# Patient Record
Sex: Female | Born: 1985 | Race: White | Hispanic: No | Marital: Married | Smoking: Never smoker
Health system: Southern US, Community
[De-identification: ages and names within clinical notes are randomized; demographics above are authoritative.]

## PROBLEM LIST (undated history)

## (undated) DIAGNOSIS — G43909 Migraine, unspecified, not intractable, without status migrainosus: Secondary | ICD-10-CM

## (undated) DIAGNOSIS — O24419 Gestational diabetes mellitus in pregnancy, unspecified control: Secondary | ICD-10-CM

## (undated) DIAGNOSIS — E119 Type 2 diabetes mellitus without complications: Secondary | ICD-10-CM

## (undated) HISTORY — PX: KIDNEY SURGERY: SHX687

---

## 2014-10-09 ENCOUNTER — Ambulatory Visit
Admission: EM | Admit: 2014-10-09 | Discharge: 2014-10-09 | Disposition: A | Discharge status: Home / Self Care | Payer: BLUE CROSS/BLUE SHIELD | Attending: Internal Medicine | Admitting: Internal Medicine

## 2014-10-09 DIAGNOSIS — M5412 Radiculopathy, cervical region: Secondary | ICD-10-CM | POA: Diagnosis not present

## 2014-10-09 HISTORY — DX: Migraine, unspecified, not intractable, without status migrainosus: G43.909

## 2014-10-09 MED ORDER — DIAZEPAM 2 MG PO TABS: 2.0000 mg | ORAL_TABLET | Freq: Two times a day (BID) | ORAL | Status: DC

## 2014-10-09 MED ORDER — NAPROXEN 500 MG PO TABS: 500.0000 mg | ORAL_TABLET | Freq: Two times a day (BID) | ORAL | Status: DC

## 2014-10-09 NOTE — ED Notes (Signed)
Multiple medical complaints. States right forearm has felt numb x 1 week "pretty persistant". Also c/o headache since Monday when menses started and states numbness and tingling in face. Earlier this week had "some tingling" right lower leg.

## 2014-10-09 NOTE — ED Provider Notes (Signed)
CSN: [id number]     Arrival date & time 10/09/14  1823 History   First MD Initiated Contact with Patient 10/09/14 1855     Chief Complaint  Patient presents with  . Headache   (Consider location/radiation/quality/duration/timing/severity/associated sxs/prior Treatment) HPI   This a 29 year old female who presents with one-week history of a stiff neck a headache  not similar to her usual migraines and numbness of her volar right forearm. Doesn't remember any specific incident that may have injured her neck but does relate sometimes sleeping on the couch 2 pillows. A physician assistant at work and recommended that she take an additional dose of ibuprofen she states helped her pain. However the increased dose of ibuprofen caused her to have a nervous feeling. He has noticed some recent photophobia and increased tolerance to loud sounds. She denies any nausea or vomiting loss of muscle power and no incontinence. She's had no symptoms of incoordination stumbling falling or syncope.  Past Medical History  Diagnosis Date  . Migraines    Past Surgical History  Procedure Laterality Date  . Kidney surgery      age 80   Family History  Problem Relation Age of Onset  . Thyroid disease Mother   . Diabetes Father    History  Substance Use Topics  . Smoking status: Never Smoker   . Smokeless tobacco: Not on file  . Alcohol Use: No   OB History    No data available     Review of Systems  Eyes: Positive for photophobia.  Neurological: Positive for numbness and headaches.  All other systems reviewed and are negative.   Allergies  Sulfa antibiotics  Home Medications   Prior to Admission medications   Medication Sig Start Date End Date Taking? Authorizing Provider  diazepam (VALIUM) 2 MG tablet Take 1 tablet (2 mg total) by mouth 2 (two) times daily at 8 am and 10 pm. 10/09/14   Lutricia Feil, PA-C  naproxen (NAPROSYN) 500 MG tablet Take 1 tablet (500 mg total) by mouth 2 (two) times  daily with a meal. 10/09/14   Lutricia Feil, PA-C   BP 115/78 mmHg  Pulse 64  Temp(Src) 97.7 F (36.5 C) (Tympanic)  Resp 16  Ht  (1.676 m)  Wt 125 lb (56.7 kg)  BMI 20.19 kg/m2  SpO2 100%  LMP 10/06/2014 (Exact Date) Physical Exam  Constitutional: She is oriented to person, place, and time. She appears well-developed and well-nourished.  HENT:  Head: Normocephalic and atraumatic.  Eyes: EOM are normal. Pupils are equal, round, and reactive to light. Right eye exhibits no discharge. Left eye exhibits no discharge.  Neck: Normal range of motion. Neck supple. No thyromegaly present.  Musculoskeletal: Normal range of motion. She exhibits no edema or tenderness.  Lymphadenopathy:    She has no cervical adenopathy.  Neurological: She is alert and oriented to person, place, and time. No cranial nerve deficit. Coordination normal.  Examination of the neck shows a normal range of motion without any discomfort with flexion, extension, rotation or flexion with lateral extension there is no axial compression tenderness. Trapezii are tender bilaterally with palpable muscle spasm more prevalent on the right. Upper extremity DTRs are brisk at 3+ over 4 and bilaterally symmetrical. Strength of the upper extremities is equal and symmetrical and strong. Lower extremity DTRs are also brisk at 3+ over 4 and she has a 2 beat clonus on the right is not present on the left. Finger-nose-finger is intact Romberg  is negative her gait is normal. Speech is intact. Patient does seem rather sad rarely smiles tends to keep her eyes closed throughout the examination. Her husband was present in the room and seemed very supportive.  Skin: Skin is warm and dry.  Psychiatric: She has a normal mood and affect. Her behavior is normal. Judgment and thought content normal.  Nursing note and vitals reviewed.   ED Course  Procedures (including critical care time) Labs Review Labs Reviewed - No data to  display  Imaging Review No results found.   MDM   1. Cervical radiculitis    Discharge Medication List as of 10/09/2014  7:34 PM    START taking these medications   Details  diazepam (VALIUM) 2 MG tablet Take 1 tablet (2 mg total) by mouth 2 (two) times daily at 8 am and 10 pm., Starting 10/09/2014, Until Discontinued, Print    naproxen (NAPROSYN) 500 MG tablet Take 1 tablet (500 mg total) by mouth 2 (two) times daily with a meal., Starting 10/09/2014, Until Discontinued, Print       Plan: 1. Diagnosis reviewed with patient 2. rx as per orders; risks, benefits, potential side effects reviewed with patient 3. Recommend supportive treatment with ice/heat Biofrereze 4. F/u prn if symptoms worsen or don't improve    Lutricia Feil, PA-C 10/09/14 1939

## 2014-10-09 NOTE — Discharge Instructions (Signed)

## 2015-04-26 NOTE — L&D Delivery Note (Signed)
Delivery Note  First Stage: Labor Induction: 01/14/16 for cervidil Labor onset: 01/15/16 @ 0900 Augmentation : Pitocin, AROM Analgesia Eliezer Lofts/Anesthesia intrapartum: Nitrous Oxide and Epidural AROM at 1136 - clear fluid  Second Stage: Complete dilation at 1128 Onset of pushing at 1430 FHR second stage: Category 2 FHR Tracing: Baseline: 150 bpm/ moderate variability / +accels/ variable decels with contractions to nadir of 90-115 bpm with good return to baseline  Delivery of a viable female "Gunnar Bullamery Kate" at 1653 by Carlean JewsMeredith Twala Collings, CNM in ROA position using the Ritgen maneuver Loose nuchal cord x 1 reduced over head Cord double clamped after cessation of pulsation, cut by FOB Cord Blood eval not indicated   Third Stage: Placenta delivered via Shultz intact with trailing membranes teased out with ring forceps with 3VC @ 1703 Placenta disposition: Hospital disposal  Uterine tone firm with massage / bleeding minimal  1st laceration identified  Anesthesia for repair: Epidural and 1% Lidocaine  Repair: 2.0 Vicryl Est. Blood Loss (mL): 250 mL  Complications: none  Mom to postpartum.  Baby to Couplet care / Skin to Skin.  Newborn: Birth Weight: 2800g (6#2.8oz) Apgar Scores: 8, 9 Feeding planned: Breast and Formula  Carlean JewsMeredith Maliq Pilley, CNM

## 2015-06-16 LAB — OB RESULTS CONSOLE ABO/RH: RH Type: POSITIVE

## 2015-06-16 LAB — OB RESULTS CONSOLE RPR: RPR: NONREACTIVE

## 2015-06-16 LAB — OB RESULTS CONSOLE HIV ANTIBODY (ROUTINE TESTING): HIV: NONREACTIVE

## 2015-06-16 LAB — OB RESULTS CONSOLE HEPATITIS B SURFACE ANTIGEN: Hepatitis B Surface Ag: NEGATIVE

## 2015-06-16 LAB — OB RESULTS CONSOLE VARICELLA ZOSTER ANTIBODY, IGG: VARICELLA IGG: IMMUNE

## 2015-06-16 LAB — OB RESULTS CONSOLE RUBELLA ANTIBODY, IGM: RUBELLA: IMMUNE

## 2015-06-16 LAB — OB RESULTS CONSOLE ANTIBODY SCREEN: Antibody Screen: NEGATIVE

## 2015-12-16 ENCOUNTER — Other Ambulatory Visit: Payer: Self-pay

## 2015-12-16 DIAGNOSIS — O359XX1 Maternal care for (suspected) fetal abnormality and damage, unspecified, fetus 1: Secondary | ICD-10-CM

## 2015-12-17 ENCOUNTER — Ambulatory Visit: Payer: BLUE CROSS/BLUE SHIELD

## 2015-12-21 LAB — OB RESULTS CONSOLE GBS: STREP GROUP B AG: NEGATIVE

## 2015-12-21 LAB — OB RESULTS CONSOLE GC/CHLAMYDIA
Chlamydia: NEGATIVE
Gonorrhea: NEGATIVE

## 2015-12-21 LAB — OB RESULTS CONSOLE RPR: RPR: NONREACTIVE

## 2015-12-24 ENCOUNTER — Ambulatory Visit
Admission: RE | Admit: 2015-12-24 | Discharge: 2015-12-24 | Disposition: A | Discharge status: Home / Self Care | Payer: Managed Care, Other (non HMO) | Source: Ambulatory Visit | Attending: Maternal & Fetal Medicine | Admitting: Maternal & Fetal Medicine

## 2015-12-24 ENCOUNTER — Other Ambulatory Visit: Payer: Self-pay | Admitting: Maternal & Fetal Medicine

## 2015-12-24 ENCOUNTER — Encounter: Payer: Self-pay | Admitting: *Deleted

## 2015-12-24 DIAGNOSIS — Z3490 Encounter for supervision of normal pregnancy, unspecified, unspecified trimester: Secondary | ICD-10-CM

## 2015-12-24 DIAGNOSIS — O365930 Maternal care for other known or suspected poor fetal growth, third trimester, not applicable or unspecified: Secondary | ICD-10-CM | POA: Diagnosis present

## 2015-12-24 DIAGNOSIS — O359XX1 Maternal care for (suspected) fetal abnormality and damage, unspecified, fetus 1: Secondary | ICD-10-CM

## 2015-12-24 DIAGNOSIS — Z3A36 36 weeks gestation of pregnancy: Secondary | ICD-10-CM | POA: Insufficient documentation

## 2015-12-31 ENCOUNTER — Other Ambulatory Visit: Payer: Self-pay | Admitting: Maternal & Fetal Medicine

## 2015-12-31 ENCOUNTER — Ambulatory Visit
Admission: RE | Admit: 2015-12-31 | Discharge: 2015-12-31 | Disposition: A | Discharge status: Home / Self Care | Payer: BLUE CROSS/BLUE SHIELD | Source: Ambulatory Visit | Attending: Maternal & Fetal Medicine | Admitting: Maternal & Fetal Medicine

## 2015-12-31 DIAGNOSIS — Z3490 Encounter for supervision of normal pregnancy, unspecified, unspecified trimester: Secondary | ICD-10-CM

## 2015-12-31 DIAGNOSIS — IMO0002 Reserved for concepts with insufficient information to code with codable children: Secondary | ICD-10-CM

## 2016-01-04 ENCOUNTER — Other Ambulatory Visit: Payer: Self-pay

## 2016-01-04 DIAGNOSIS — O365990 Maternal care for other known or suspected poor fetal growth, unspecified trimester, not applicable or unspecified: Secondary | ICD-10-CM

## 2016-01-07 ENCOUNTER — Telehealth: Payer: Self-pay

## 2016-01-07 ENCOUNTER — Inpatient Hospital Stay: Admission: RE | Admit: 2016-01-07 | Payer: BLUE CROSS/BLUE SHIELD | Source: Ambulatory Visit

## 2016-01-07 NOTE — Telephone Encounter (Signed)
Patient unable to keep appointment today, notified Dr. Quin HoopEllestad.  Plan of care established by Dr. Quin HoopEllestad is for the patient to [facility] and [facility] Monday for BPP/Dopplers.  Appointment scheduled for Monday, September 18th @ 1600, patient aware.  Patient instructed to call [facility] to schedule NST for tomorrow.  [facility] notified of patient inability to keep appointment today and the plan of care recommendations.  [facility] staff to contact patient to schedule a NST appointment.

## 2016-01-11 ENCOUNTER — Ambulatory Visit
Admission: RE | Admit: 2016-01-11 | Discharge: 2016-01-11 | Disposition: A | Discharge status: Home / Self Care | Payer: Managed Care, Other (non HMO) | Source: Ambulatory Visit | Attending: Obstetrics and Gynecology | Admitting: Obstetrics and Gynecology

## 2016-01-11 DIAGNOSIS — Z3A38 38 weeks gestation of pregnancy: Secondary | ICD-10-CM | POA: Insufficient documentation

## 2016-01-11 DIAGNOSIS — O365990 Maternal care for other known or suspected poor fetal growth, unspecified trimester, not applicable or unspecified: Secondary | ICD-10-CM | POA: Insufficient documentation

## 2016-01-11 DIAGNOSIS — O359XX1 Maternal care for (suspected) fetal abnormality and damage, unspecified, fetus 1: Secondary | ICD-10-CM | POA: Insufficient documentation

## 2016-01-13 ENCOUNTER — Emergency Department
Admission: EM | Admit: 2016-01-13 | Discharge: 2016-01-13 | Disposition: A | Discharge status: Home / Self Care | Payer: BLUE CROSS/BLUE SHIELD

## 2016-01-14 ENCOUNTER — Inpatient Hospital Stay
Admission: EM | Admit: 2016-01-14 | Discharge: 2016-01-17 | DRG: 775 | Disposition: A | Discharge status: Home / Self Care | Payer: Managed Care, Other (non HMO) | Attending: Obstetrics and Gynecology | Admitting: Obstetrics and Gynecology

## 2016-01-14 ENCOUNTER — Encounter: Payer: Self-pay | Admitting: *Deleted

## 2016-01-14 DIAGNOSIS — Z9889 Other specified postprocedural states: Secondary | ICD-10-CM | POA: Diagnosis not present

## 2016-01-14 DIAGNOSIS — Z882 Allergy status to sulfonamides status: Secondary | ICD-10-CM

## 2016-01-14 DIAGNOSIS — Z833 Family history of diabetes mellitus: Secondary | ICD-10-CM

## 2016-01-14 DIAGNOSIS — Z3A39 39 weeks gestation of pregnancy: Secondary | ICD-10-CM

## 2016-01-14 DIAGNOSIS — Z8349 Family history of other endocrine, nutritional and metabolic diseases: Secondary | ICD-10-CM

## 2016-01-14 LAB — CBC
HEMATOCRIT: 37.6 % (ref 35.0–47.0)
Hemoglobin: 12.9 g/dL (ref 12.0–16.0)
MCH: 32.3 pg (ref 26.0–34.0)
MCHC: 34.4 g/dL (ref 32.0–36.0)
MCV: 93.9 fL (ref 80.0–100.0)
Platelets: 181 10*3/uL (ref 150–440)
RBC: 4 MIL/uL (ref 3.80–5.20)
RDW: 15.5 % — ABNORMAL HIGH (ref 11.5–14.5)
WBC: 10.5 10*3/uL (ref 3.6–11.0)

## 2016-01-14 LAB — TYPE AND SCREEN
ABO/RH(D): B POS
Antibody Screen: NEGATIVE

## 2016-01-14 MED ORDER — LIDOCAINE HCL (PF) 1 % IJ SOLN: 30.0000 mL | INTRAMUSCULAR | Status: DC | PRN

## 2016-01-14 MED ORDER — MAGNESIUM SULFATE 50 % IJ SOLN: 1.0000 g/h | INTRAVENOUS | Status: DC

## 2016-01-14 MED ORDER — OXYTOCIN 40 UNITS IN LACTATED RINGERS INFUSION - SIMPLE MED: 2.5000 [IU]/h | INTRAVENOUS | Status: DC

## 2016-01-14 MED ORDER — ACETAMINOPHEN 325 MG PO TABS
650.0000 mg | ORAL_TABLET | ORAL | Status: DC | PRN
  Filled 2016-01-14: qty 2

## 2016-01-14 MED ORDER — OXYCODONE-ACETAMINOPHEN 5-325 MG PO TABS: 2.0000 | ORAL_TABLET | ORAL | Status: DC | PRN

## 2016-01-14 MED ORDER — LACTATED RINGERS IV SOLN
INTRAVENOUS | Status: DC
  Administered 2016-01-14 – 2016-01-15 (×2): via INTRAVENOUS

## 2016-01-14 MED ORDER — OXYCODONE-ACETAMINOPHEN 5-325 MG PO TABS: 1.0000 | ORAL_TABLET | ORAL | Status: DC | PRN

## 2016-01-14 MED ORDER — OXYTOCIN BOLUS FROM INFUSION: 500.0000 mL | Freq: Once | INTRAVENOUS | Status: DC

## 2016-01-14 MED ORDER — SOD CITRATE-CITRIC ACID 500-334 MG/5ML PO SOLN
30.0000 mL | ORAL | Status: DC | PRN
  Filled 2016-01-14: qty 30

## 2016-01-14 MED ORDER — ONDANSETRON HCL 4 MG/2ML IJ SOLN: 4.0000 mg | Freq: Four times a day (QID) | INTRAMUSCULAR | Status: DC | PRN

## 2016-01-14 MED ORDER — LACTATED RINGERS IV SOLN: 500.0000 mL | INTRAVENOUS | Status: DC | PRN

## 2016-01-14 NOTE — H&P (Signed)
HISTORY AND PHYSICAL  HISTORY OF PRESENT ILLNESS: [name] is a 30 y.o. G1P0 at 4449w0d by LMP consistent with  week ultrasound with a pregnancy complicated by IUGR presenting for induction of labor.   She has not been having contractions and denies leakage of fluid, vaginal bleeding, or decreased fetal movement.    REVIEW OF SYSTEMS: A complete review of systems was performed and was specifically negative for headache, changes in vision, RUQ pain, shortness of breath, chest pain, lower extremity edema and dysuria.   HISTORY:  Past Medical History:  Diagnosis Date  . Migraines     Past Surgical History:  Procedure Laterality Date  . KIDNEY SURGERY     age 89    No current facility-administered medications on file prior to encounter.    Current Outpatient Prescriptions on File Prior to Encounter  Medication Sig Dispense Refill  . ferrous sulfate 325 (65 FE) MG EC tablet Take 325 mg by mouth 3 (three) times daily with meals.    . Prenatal Vit-Fe Fumarate-FA (MULTIVITAMIN-PRENATAL) 27-0.8 MG TABS tablet Take 1 tablet by mouth daily at 12 noon.       Allergies  Allergen Reactions  . Sulfa Antibiotics Rash    OB History  Gravida Para Term Preterm AB Living  1         0  SAB TAB Ectopic Multiple Live Births               # Outcome Date GA Lbr Len/2nd Weight Sex Delivery Anes PTL Lv  1 Current              Family History  Problem Relation Age of Onset  . Thyroid disease Mother   . Diabetes Father    Gynecologic History: Neg GC/CH History of Abnormal Pap Smear: None found History of STI:neg  Social History  Substance Use Topics  . Smoking status: Never Smoker  . Smokeless tobacco: Never Used  . Alcohol use No    PHYSICAL EXAM: Temp:  [98.3 F (36.8 C)] 98.3 F (36.8 C) (09/21 1642) Pulse Rate:  [70] 70 (09/21 1642) Resp:  [16] 16 (09/21 1642) BP: (118)/(80) 118/80 (09/21 1642) Weight:  [155 lb (70.3 kg)] 155 lb (70.3 kg) (09/21 1728)  GENERAL: NAD  AAOx3 CHEST:CTAB no increased work of breathing CV:RRR no appreciable murmurs, rubs, gallops ABDOMEN: gravid, nontender, EFW g by Leopolds EXTREMITIES:  Warm and well-perfused, nontender, nonedematous, DTRs  clonus CERVIX: 4/80/vtx-2   FHT:s baseline with  Mod variability, +  accelerations and no  Decelerations, Strip reviewed and pt has had variable decels. Pt had 3 late decels much earlier  Toco: q 2 mins  DIAGNOSTIC STUDIES: No results for input(s): WBC, HGB, HCT, PLT, NA, K, CL, CO2, BUN, CREATININE, LABGLOM, GLUCOSE, CALCIUM, BILIDIR, ALKPHOS, AST, ALT, PROT, MG in the last 168 hours.  Invalid input(s): LABALB, UA  PRENATAL STUDIES:  Prenatal Labs:  MBT: B pos; Rubella immune, Varicella immune, HIV neg, RPR neg, Hep B neg, GC/CT neg, GBS neg , glucola 73  Last US Dopplers 01/11/16 38 4/7  Wks:2.14 Dopplers WNL, .placenta  above the os, AF wnl, normal anatomy  ASSESSMENT AND PLAN:  1. Fetal Well being  - Fetal Tracing: reviewed and Cat 1 now - Ultrasound:  reviewed, as above - Group B Streptococcus: neg - Presentation: confirmed by RN  2. Routine OB: - Prenatal labs reviewed, as above - Rh pos  3. Induction of Labor:  -  Contractions external toco in place -  Pelvis has not been proven -  Plan for induction with Pitocin   4. Post Partum Planning: - Infant feeding: Breast

## 2016-01-14 NOTE — Progress Notes (Signed)
Blood bank calling to report T&S has hemolysed, will need new order place to recollect.

## 2016-01-15 ENCOUNTER — Inpatient Hospital Stay: Payer: Managed Care, Other (non HMO) | Admitting: Anesthesiology

## 2016-01-15 MED ORDER — ONDANSETRON HCL 4 MG PO TABS: 4.0000 mg | ORAL_TABLET | ORAL | Status: DC | PRN

## 2016-01-15 MED ORDER — SIMETHICONE 80 MG PO CHEW
80.0000 mg | CHEWABLE_TABLET | ORAL | Status: DC | PRN
Start: 2016-01-15 — End: 2016-01-17

## 2016-01-15 MED ORDER — ZOLPIDEM TARTRATE 5 MG PO TABS
5.0000 mg | ORAL_TABLET | Freq: Every evening | ORAL | Status: DC | PRN
Start: 2016-01-15 — End: 2016-01-17

## 2016-01-15 MED ORDER — FENTANYL 2.5 MCG/ML W/ROPIVACAINE 0.2% IN NS 100 ML EPIDURAL INFUSION (ARMC-ANES)
EPIDURAL | Status: DC | PRN
  Administered 2016-01-15: 10 mL/h via EPIDURAL

## 2016-01-15 MED ORDER — PRENATAL MULTIVITAMIN CH
1.0000 | ORAL_TABLET | Freq: Every day | ORAL | Status: DC
  Administered 2016-01-16 – 2016-01-17 (×2): 1 via ORAL
  Filled 2016-01-15 (×2): qty 1

## 2016-01-15 MED ORDER — FENTANYL 2.5 MCG/ML W/ROPIVACAINE 0.2% IN NS 100 ML EPIDURAL INFUSION (ARMC-ANES)
EPIDURAL | Status: AC
  Filled 2016-01-15: qty 100

## 2016-01-15 MED ORDER — DIBUCAINE 1 % RE OINT: 1.0000 "application " | TOPICAL_OINTMENT | RECTAL | Status: DC | PRN

## 2016-01-15 MED ORDER — COCONUT OIL OIL
1.0000 | TOPICAL_OIL | Status: DC | PRN
Start: 2016-01-15 — End: 2016-01-17
  Administered 2016-01-15: 1 via TOPICAL
  Filled 2016-01-15: qty 120

## 2016-01-15 MED ORDER — DIPHENHYDRAMINE HCL 25 MG PO CAPS: 25.0000 mg | ORAL_CAPSULE | Freq: Four times a day (QID) | ORAL | Status: DC | PRN

## 2016-01-15 MED ORDER — LIDOCAINE HCL (PF) 1 % IJ SOLN
INTRAMUSCULAR | Status: DC | PRN
  Administered 2016-01-15: 3 mL

## 2016-01-15 MED ORDER — ACETAMINOPHEN 325 MG PO TABS: 650.0000 mg | ORAL_TABLET | ORAL | Status: DC | PRN

## 2016-01-15 MED ORDER — LIDOCAINE-EPINEPHRINE (PF) 1.5 %-1:200000 IJ SOLN
INTRAMUSCULAR | Status: DC | PRN
  Administered 2016-01-15: 3 mL via PERINEURAL

## 2016-01-15 MED ORDER — ONDANSETRON HCL 4 MG/2ML IJ SOLN: 4.0000 mg | INTRAMUSCULAR | Status: DC | PRN

## 2016-01-15 MED ORDER — IBUPROFEN 600 MG PO TABS
600.0000 mg | ORAL_TABLET | Freq: Four times a day (QID) | ORAL | Status: DC
  Administered 2016-01-15 – 2016-01-17 (×7): 600 mg via ORAL
  Filled 2016-01-15 (×7): qty 1

## 2016-01-15 MED ORDER — OXYCODONE-ACETAMINOPHEN 5-325 MG PO TABS: 2.0000 | ORAL_TABLET | ORAL | Status: DC | PRN

## 2016-01-15 MED ORDER — SENNOSIDES-DOCUSATE SODIUM 8.6-50 MG PO TABS
2.0000 | ORAL_TABLET | ORAL | Status: DC
  Administered 2016-01-15 – 2016-01-17 (×2): 2 via ORAL
  Filled 2016-01-15: qty 2

## 2016-01-15 MED ORDER — WITCH HAZEL-GLYCERIN EX PADS: 1.0000 "application " | MEDICATED_PAD | CUTANEOUS | Status: DC | PRN

## 2016-01-15 MED ORDER — BENZOCAINE-MENTHOL 20-0.5 % EX AERO
1.0000 "application " | INHALATION_SPRAY | CUTANEOUS | Status: DC | PRN
  Administered 2016-01-15: 1 via TOPICAL
  Filled 2016-01-15: qty 56

## 2016-01-15 MED ORDER — BUPIVACAINE HCL (PF) 0.25 % IJ SOLN
INTRAMUSCULAR | Status: DC | PRN
  Administered 2016-01-15 (×2): 5 mL via EPIDURAL

## 2016-01-15 MED ORDER — OXYCODONE-ACETAMINOPHEN 5-325 MG PO TABS: 1.0000 | ORAL_TABLET | ORAL | Status: DC | PRN

## 2016-01-15 NOTE — Progress Notes (Signed)
S:  Late decel at 1128  To nadir of 80bpm due to hypotension, and supine position - pitocin off, IVF bolus, oxygen by face mask, left lateral position - good return to baseline 150 bpm       Discussed risks/benefits of AROM and patient and husband agree  O:  VS: Blood pressure 130/74, pulse 60, temperature 98 F (36.7 C), temperature source Axillary, resp. rate 18, height 5\' 6"  (1.676 m), weight 70.3 kg (155 lb), SpO2 100 %.        FHR : baseline 145-150 bpm / variability moderate / accelerations + / occasional early deceleration and late decel as mentioned above        Toco: contractions every 1-3 minutes / strong         Cervix : Dilation: 10 Dilation Complete Date: 01/15/16 Dilation Complete Time: 1128 Effacement (%): 100 Cervical Position: Middle Station: +1 Presentation: Vertex Exam by:: M.Shyasia Funches, CNM        Membranes: AROM - clear fluid  A: Active labor     FHR category 2  P: Continue expectant management      Anticipate NSVD  Dr. Elesa MassedWard aware and updated on status and plan of care.   Carlean JewsMeredith Shaindel Sweeten, CNM

## 2016-01-15 NOTE — Plan of Care (Signed)
svd of viable female . 513pm . Repair of 1st degree laceration by meredith sigmon,cnm

## 2016-01-15 NOTE — Anesthesia Procedure Notes (Signed)
Epidural Patient location during procedure: OB Start time: 01/15/2016 10:42 AM End time: 01/15/2016 11:15 AM  Staffing Resident/CRNA: Junious SilkNOLES, Stephana Morell Performed: resident/CRNA   Preanesthetic Checklist Completed: patient identified, site marked, surgical consent, pre-op evaluation, timeout performed, IV checked, risks and benefits discussed and monitors and equipment checked  Epidural Patient position: sitting Prep: Betadine Patient monitoring: heart rate, continuous pulse ox and blood pressure Approach: midline Location: L4-L5 Injection technique: LOR air  Needle:  Needle type: Tuohy  Needle gauge: 18 G Needle length: 9 cm and 9 Catheter type: closed end flexible Catheter size: 20 Guage Test dose: negative and 1.5% lidocaine with Epi 1:200 K  Assessment Sensory level: T10 Events: blood not aspirated, injection not painful, no injection resistance, negative IV test and no paresthesia  Additional Notes   Patient tolerated the insertion well without complications.Reason for block:procedure for pain

## 2016-01-15 NOTE — Progress Notes (Signed)
Discussed with pt and s/o plan for labor pain mgmt. Pt states she would like to try nitrous and not really wanting an Epidural but will keep it optional. Nitrous info sheet with instructions for usage, patient agreement and what to expect reviewed with pt and s/o. Questions addressed. Understanding verbalized.

## 2016-01-15 NOTE — Progress Notes (Signed)
Repair still in progress

## 2016-01-15 NOTE — Progress Notes (Signed)
Pt pushing well after epidural turned down to 2. Meredith in room with pt.

## 2016-01-15 NOTE — Progress Notes (Signed)
S:  Pt. Requesting epidural - has not used nitrous in >30 minutes   O:  VS: Blood pressure 130/74, pulse 60, temperature 98 F (36.7 C), temperature source Axillary, resp. rate 18, height 5\' 6"  (1.676 m), weight 70.3 kg (155 lb), SpO2 100 %.        FHR : baseline 135 bpm / variability moderate / accelerations + / occ variable decelerations to nadir of 90bmp with good return to baseline         Toco: contractions every 2-3 minutes / moderate        Cervix : 9cm/100%/-2/vtx        Membranes: intact, BBOW  A: Active labor     FHR category 2  P: Plan for epidural     Discussed risks of AROM with high fetal station and advised for patient to be comfortable with epidural and let the baby come down for possible slow rupture      Anticipate NSVD  Dr. Elesa MassedWard updated with plan of care  Dana Benton, CNM

## 2016-01-15 NOTE — Progress Notes (Signed)
S:Nitrous is helping . + CTX, no LOF, VB O: Vitals:   01/15/16 0714 01/15/16 0716 01/15/16 0721 01/15/16 0745  BP: 122/81   130/74  Pulse: 73   60  Resp:      Temp:      TempSrc:      SpO2:  100% 100%   Weight:      Height:        Gen: NAD, AAOx3      Abd: FNTTP      Ext: Non-tender, Nonedmeatous    FHT:+  mod var + accelerations, Variable decelerations with Cat 2 strip TOCO: Q 2min SVE:5/90/vtx-2   A/P:  30 y.o. yo G1P0 at 1984w1d for IUGR  Labor: progressing  FWB: Reassuring Cat 1 tracing. EFW 5#2  GBS:neg   Sharee Pimplearon W Jones 7:54 AM

## 2016-01-15 NOTE — Progress Notes (Signed)
I am assuming care of this patient, with Dr. Elesa MassedWard as my back-up S: Doing well with nitrous oxide, on birthing ball, back massage     Still having intense low back pain      Would like to discuss cervical exam and AROM with partner and family before proceeding      On 6 milliunits of Pitocin   O:  VS: Blood pressure 130/74, pulse 60, temperature 98 F (36.7 C), temperature source Axillary, resp. rate 18, height 5\' 6"  (1.676 m), weight 70.3 kg (155 lb), SpO2 100 %.        FHR : baseline 130 bpm / variability moderate / accelerations + / occasional variable decelerations        Toco: contractions every 1-2.5 minutes / moderate         Cervix : Dilation: 5 Effacement (%): 90 Cervical Position: Middle Station: -2 Presentation: Vertex Exam by:: Manual MeierN. Aten, RN        Membranes: Intact  A: Latent labor     FHR category 2  P: Continue expectant management      Patient to notify nurse when she is ready for cervical exam and AROM      Reassess in 1-2 hours     Anticipate NSVD  Dana Benton, CNM

## 2016-01-16 LAB — CBC
HCT: 33.9 % — ABNORMAL LOW (ref 35.0–47.0)
Hemoglobin: 11.3 g/dL — ABNORMAL LOW (ref 12.0–16.0)
MCH: 31.4 pg (ref 26.0–34.0)
MCHC: 33.2 g/dL (ref 32.0–36.0)
MCV: 94.4 fL (ref 80.0–100.0)
PLATELETS: 161 10*3/uL (ref 150–440)
RBC: 3.59 MIL/uL — AB (ref 3.80–5.20)
RDW: 14.7 % — AB (ref 11.5–14.5)
WBC: 14.2 10*3/uL — ABNORMAL HIGH (ref 3.6–11.0)

## 2016-01-16 LAB — RPR: RPR Ser Ql: NONREACTIVE

## 2016-01-16 NOTE — Anesthesia Postprocedure Evaluation (Signed)
Anesthesia Post Note  Patient: Vernice JeffersonJennifer Presswood  Procedure(s) Performed: * No procedures listed *  Patient location during evaluation: Mother Baby Anesthesia Type: Epidural Level of consciousness: awake and alert Pain management: pain level controlled Vital Signs Assessment: post-procedure vital signs reviewed and stable Respiratory status: spontaneous breathing, nonlabored ventilation and respiratory function stable Cardiovascular status: stable Postop Assessment: no headache, no backache and epidural receding Anesthetic complications: no Comments: Patient with some mild back pain; no headache or pruritis at this time; epidural site is clean, dry, and without draining or erythema    Last Vitals:  Vitals:   01/16/16 0511 01/16/16 0745  BP: (!) 109/56 120/73  Pulse: 69 65  Resp: 19   Temp: 36.6 C 37.2 C    Last Pain:  Vitals:   01/16/16 0745  TempSrc: Oral  PainSc:                  Lenard SimmerAndrew Natividad Schlosser

## 2016-01-16 NOTE — Progress Notes (Signed)
PPD #1, SVD, baby girl "Gunnar Bullamery Kate"  S:  Reports feeling good, but having muscle soreness in legs             Tolerating po/ No nausea or vomiting             Bleeding is light             Pain controlled with Tylenol and Motrin             Up ad lib / ambulatory / voiding QS  Newborn breast feeding with formula supplementation   O:               VS: BP 120/73   Pulse 65   Temp 99 F (37.2 C) (Oral)   Resp 19   Ht 5\' 6"  (1.676 m)   Wt 70.3 kg (155 lb)   LMP  (LMP Unknown)   SpO2 99%   Breastfeeding? Unknown   BMI 25.02 kg/m    LABS:              Recent Labs  01/14/16 2124 01/16/16 0642  WBC 10.5 14.2*  HGB 12.9 11.3*  PLT 181 161               Blood type: --/--/B POS (09/21 2237)  Rubella: Immune (02/21 0000)                     I&O: Intake/Output      09/22 0701 - 09/23 0700 09/23 0701 - 09/24 0700   Urine (mL/kg/hr) 600 (0.4)    Blood 250 (0.1)    Total Output 850     Net -850          Urine Occurrence 1 x                  Physical Exam:             Alert and oriented X3  Lungs: Clear and unlabored  Heart: regular rate and rhythm / no mumurs  Abdomen: soft, non-tender, non-distended              Fundus: firm, non-tender, U-2  Perineum: well approximated 1st degree laceration, healing well, no significant erythema, no significant edema  Lochia: light, no clots   Extremities: no edema, no calf pain or tenderness    A: PPD # 1, SVD   Doing well - stable status  P: Routine post partum orders  See Lactation today  May shower and ambulate  D/C IV  Anticipate DC home tomorrow  Carlean JewsMeredith Francille Wittmann, CNM

## 2016-01-17 MED ORDER — IBUPROFEN 600 MG PO TABS: 600.0000 mg | ORAL_TABLET | Freq: Four times a day (QID) | ORAL | 0 refills | Status: DC

## 2016-01-17 NOTE — Progress Notes (Signed)
Discharge instructions provided.  Pt and sig other verbalize understanding of all instructions and follow-up care.  Prescription given.  Pt discharged to home with infant at 1540 on 01/17/16 via wheelchair by CNA. Reynold BowenSusan Paisley Jerlisa Diliberto, RN 01/17/2016 4:10 PM

## 2016-01-17 NOTE — Discharge Instructions (Signed)
Care After Vaginal Delivery °Congratulations on your new baby!! ° °Refer to this sheet in the next few weeks. These discharge instructions provide you with information on caring for yourself after delivery. Your caregiver may also give you specific instructions. Your treatment has been planned according to the most current medical practices available, but problems sometimes occur. Call your caregiver if you have any problems or questions after you go home. ° °HOME CARE INSTRUCTIONS °· Take over-the-counter or prescription medicines only as directed by your caregiver or pharmacist. °· Do not drink alcohol, especially if you are breastfeeding or taking medicine to relieve pain. °· Do not chew or smoke tobacco. °· Do not use illegal drugs. °· Continue to use good perineal care. Good perineal care includes: °¨ Wiping your perineum from front to back. °¨ Keeping your perineum clean. °· Do not use tampons or douche until your caregiver says it is okay. °· Shower, wash your hair, and take tub baths as directed by your caregiver. °· Wear a well-fitting bra that provides breast support. °· Eat healthy foods. °· Drink enough fluids to keep your urine clear or pale yellow. °· Eat high-fiber foods such as whole grain cereals and breads, brown rice, beans, and fresh fruits and vegetables every day. These foods may help prevent or relieve constipation. °· Follow your caregiver's recommendations regarding resumption of activities such as climbing stairs, driving, lifting, exercising, or traveling. Specifically, no driving for two weeks, so that you are comfortable reacting quickly in an emergency. °· Talk to your caregiver about resuming sexual activities. Resumption of sexual activities is dependent upon your risk of infection, your rate of healing, and your comfort and desire to resume sexual activity. Usually we recommend waiting about six weeks, or until your bleeding stops and you are interested in sex. °· Try to have someone  help you with your household activities and your newborn for at least a few days after you leave the hospital. Even longer is better. °· Rest as much as possible. Try to rest or take a nap when your newborn is sleeping. Sleep deprivation can be very hard after delivery. °· Increase your activities gradually. °· Keep all of your scheduled postpartum appointments. It is very important to keep your scheduled follow-up appointments. At these appointments, your caregiver will be checking to make sure that you are healing physically and emotionally. ° °SEEK MEDICAL CARE IF:  °· You are passing large clots from your vagina.  °· You have a foul smelling discharge from your vagina. °· You have trouble urinating. °· You are urinating frequently. °· You have pain when you urinate. °· You have a change in your bowel movements. °· You have increasing redness, pain, or swelling near your vaginal incision (episiotomy) or vaginal tear. °· You have pus draining from your episiotomy or vaginal tear. °· Your episiotomy or vaginal tear is separating. °· You have painful, hard, or reddened breasts. °· You have a severe headache. °· You have blurred vision or see spots. °· You feel sad or depressed. °· You have thoughts of hurting yourself or your newborn. °· You have questions about your care, the care of your newborn, or medicines. °· You are dizzy or light-headed. °· You have a rash. °· You have nausea or vomiting. °· You were breastfeeding and have not had a menstrual period within 12 weeks after you stopped breastfeeding. °· You are not breastfeeding and have not had a menstrual period by the 12th week after delivery. °· You   have a fever. ° °SEEK IMMEDIATE MEDICAL CARE IF:  °· You have persistent pain. °· You have chest pain. °· You have shortness of breath. °· You faint. °· You have leg pain. °· You have stomach pain. °· Your vaginal bleeding saturates two or more sanitary pads in 1 hour. ° °MAKE SURE YOU:  °· Understand these  instructions. °· Will get help right away if you are not doing well or get worse. °·  °Document Released: 04/08/2000 Document Revised: 08/26/2013 Document Reviewed: 12/07/2011 ° °[employer]® Patient Information ©2015 [employer]. This information is not intended to replace advice given to you by your health care provider. Make sure you discuss any questions you have with your health care provider. ° °Call your doctor for increased pain or vaginal bleeding, temperature above 100.4, depression, or concerns.  No strenuous activity or heavy lifting for 6 weeks.  No intercourse, tampons, douching, or enemas for 6 weeks.  No tub baths-showers only.  No driving for 2 weeks or while taking pain medications.  Continue prenatal vitamin and iron. Increase calories and fluids while breastfeeding. ° °

## 2016-01-17 NOTE — Discharge Summary (Signed)
Obstetric Discharge Summary   Patient ID: Dana JeffersonJennifer Benton MRN: [id number] DOB/AGE: Oct 03, 1985 30 y.o.   Date of Admission: 01/14/2016  Date of Discharge: 01/17/16  Admitting Diagnosis: Induction of labor at 4624w1d for IUGR   Secondary Diagnosis: none  Mode of Delivery: normal spontaneous vaginal delivery on 01/15/16     Discharge Diagnosis: Postpartum Care following Vaginal Delivery, 1st degree laceration   Intrapartum Procedures: Atificial rupture of membranes, epidural and pitocin augmentation   Post partum procedures: none  Complications: none   Brief Hospital Course  Dana Benton is a G1P1001 who had a SVD on 01/15/16;  for further details of this delivery, please refer to the delivery note.  Patient had an uncomplicated postpartum course.  By time of discharge on PPD#2, her pain was controlled on oral pain medications; she had appropriate lochia and was ambulating, voiding without difficulty and tolerating regular diet.  She was deemed stable for discharge to home.     Labs: CBC Latest Ref Rng & Units 01/16/2016 01/14/2016  WBC 3.6 - 11.0 K/uL 14.2(H) 10.5  Hemoglobin 12.0 - 16.0 g/dL 11.3(L) 12.9  Hematocrit 35.0 - 47.0 % 33.9(L) 37.6  Platelets 150 - 440 K/uL 161 181   B POS  Physical exam:  Blood pressure 119/70, pulse 76, temperature 97.8 F (36.6 C), temperature source Oral, resp. rate 18, height 5\' 6"  (1.676 m), weight 70.3 kg (155 lb), SpO2 98 %, unknown if currently breastfeeding. General: alert and no distress Lochia: appropriate Abdomen: soft, NT Uterine Fundus: firm Perineum: healing well, no significant drainage, no dehiscence, no significant erythema Extremities: No evidence of DVT seen on physical exam. No lower extremity edema.  Discharge Instructions: Per After Visit Summary. Activity: Advance as tolerated. Pelvic rest for 6 weeks.  Also refer to After Visit Summary Diet: Regular Medications:   Medication List    TAKE these medications    ferrous sulfate 325 (65 FE) MG EC tablet Take 325 mg by mouth 3 (three) times daily with meals.   ibuprofen 600 MG tablet Commonly known as:  ADVIL,MOTRIN Take 1 tablet (600 mg total) by mouth every 6 (six) hours.   multivitamin-prenatal 27-0.8 MG Tabs tablet Take 1 tablet by mouth daily at 12 noon.      Outpatient follow up:  Follow-up Information    Karena AddisonSigmon, Jamarrion Budai C, CNM. Schedule an appointment as soon as possible for a visit in 6 week(s).   Specialty:  Certified Nurse Midwife Why:  Postpartum visit Contact information: [address] [facility] [place] [id number] [phone]          Postpartum contraception: condoms  Discharged Condition: good  Discharged to: home  Tdap given: 11/25/15 [facility]  Newborn Data:  Baby Girl named "Gunnar Bullamery Kate"  Disposition:home with mother  Apgars: APGAR (1 MIN): 8   APGAR (5 MINS): 9     Baby Feeding: Breast and Formula  Karena AddisonSigmon, Linley Moxley C, CNM 01/17/2016

## 2016-01-18 NOTE — Anesthesia Preprocedure Evaluation (Addendum)
Anesthesia Evaluation  Patient identified by MRN, date of birth, ID band Patient awake    Reviewed: Allergy & Precautions, H&P , NPO status   Airway Mallampati: II  TM Distance: <3 FB Neck ROM: full    Dental   Pulmonary neg pulmonary ROS,           Cardiovascular negative cardio ROS       Neuro/Psych negative neurological ROS  negative psych ROS   GI/Hepatic negative GI ROS, Neg liver ROS,   Endo/Other  negative endocrine ROS  Renal/GU negative Renal ROS  negative genitourinary   Musculoskeletal   Abdominal   Peds  Hematology negative hematology ROS (+)   Anesthesia Other Findings   Reproductive/Obstetrics (+) Pregnancy                             Anesthesia Physical Anesthesia Plan  ASA: II  Anesthesia Plan:    Post-op Pain Management:    Induction:   Airway Management Planned:   Additional Equipment:   Intra-op Plan:   Post-operative Plan:   Informed Consent:   Plan Discussed with:   Anesthesia Plan Comments:         Anesthesia Quick Evaluation

## 2016-01-21 ENCOUNTER — Inpatient Hospital Stay: Admission: RE | Admit: 2016-01-21 | Payer: Managed Care, Other (non HMO) | Admitting: Obstetrics and Gynecology

## 2021-04-25 NOTE — L&D Delivery Note (Signed)
Delivery Note  First Stage: Labor onset: 2000 Augmentation : AROM Analgesia /Anesthesia intrapartum: epidural AROM 03/30/22 at 2000  Second Stage: Complete dilation at 0020, passive descent x 1.5hrs Onset of pushing at 0153 FHR second stage Cat I tracing until prolonged decel at 0140  Delivery of a viable female infant on 03/31/22 at 0238  by CNM delivery of fetal head in LOA position with restitution to LOT. No nuchal cord;  Anterior then posterior shoulders delivered easily with gentle downward traction. Baby placed on mom's chest, and attended to by peds.  Cord double clamped after cessation of pulsation, cut by FOB  Third Stage: Placenta delivered spontaneously intact with 3 VC @ 0243 Placenta disposition: routine disposal Uterine tone Firm / bleeding scant  Intact  cervix, vagina, and perineum identified  Anesthesia for repair: n/a Est. Blood Loss (mL): 51ml  Complications: none  Mom to postpartum.  Baby to Couplet care / Skin to Skin.  Newborn: Birth Weight: pending  Apgar Scores: 9/10 Feeding planned: breast

## 2021-09-15 LAB — OB RESULTS CONSOLE VARICELLA ZOSTER ANTIBODY, IGG: Varicella: IMMUNE

## 2021-09-15 LAB — OB RESULTS CONSOLE RUBELLA ANTIBODY, IGM: Rubella: IMMUNE

## 2022-03-09 LAB — OB RESULTS CONSOLE GC/CHLAMYDIA
Chlamydia: NEGATIVE
Neisseria Gonorrhea: NEGATIVE

## 2022-03-09 LAB — OB RESULTS CONSOLE RPR: RPR: NONREACTIVE

## 2022-03-09 LAB — OB RESULTS CONSOLE HIV ANTIBODY (ROUTINE TESTING): HIV: NONREACTIVE

## 2022-03-09 LAB — OB RESULTS CONSOLE GBS: GBS: NEGATIVE

## 2022-03-09 LAB — OB RESULTS CONSOLE HEPATITIS B SURFACE ANTIGEN: Hepatitis B Surface Ag: NEGATIVE

## 2022-03-30 ENCOUNTER — Other Ambulatory Visit: Payer: Self-pay

## 2022-03-30 ENCOUNTER — Inpatient Hospital Stay
Admission: EM | Admit: 2022-03-30 | Discharge: 2022-04-01 | DRG: 806 | Disposition: A | Discharge status: Home / Self Care | Payer: Managed Care, Other (non HMO) | Attending: Obstetrics and Gynecology | Admitting: Obstetrics and Gynecology

## 2022-03-30 ENCOUNTER — Inpatient Hospital Stay: Payer: Managed Care, Other (non HMO) | Admitting: Anesthesiology

## 2022-03-30 ENCOUNTER — Encounter: Payer: Self-pay | Admitting: Obstetrics and Gynecology

## 2022-03-30 DIAGNOSIS — O9081 Anemia of the puerperium: Secondary | ICD-10-CM | POA: Diagnosis not present

## 2022-03-30 DIAGNOSIS — D62 Acute posthemorrhagic anemia: Secondary | ICD-10-CM | POA: Diagnosis not present

## 2022-03-30 DIAGNOSIS — O24420 Gestational diabetes mellitus in childbirth, diet controlled: Secondary | ICD-10-CM | POA: Diagnosis present

## 2022-03-30 DIAGNOSIS — Z3A39 39 weeks gestation of pregnancy: Secondary | ICD-10-CM

## 2022-03-30 DIAGNOSIS — O26893 Other specified pregnancy related conditions, third trimester: Secondary | ICD-10-CM | POA: Diagnosis present

## 2022-03-30 LAB — CBC
HCT: 34 % — ABNORMAL LOW (ref 36.0–46.0)
Hemoglobin: 11 g/dL — ABNORMAL LOW (ref 12.0–15.0)
MCH: 29.5 pg (ref 26.0–34.0)
MCHC: 32.4 g/dL (ref 30.0–36.0)
MCV: 91.2 fL (ref 80.0–100.0)
Platelets: 227 10*3/uL (ref 150–400)
RBC: 3.73 MIL/uL — ABNORMAL LOW (ref 3.87–5.11)
RDW: 14.2 % (ref 11.5–15.5)
WBC: 11.8 10*3/uL — ABNORMAL HIGH (ref 4.0–10.5)
nRBC: 0 % (ref 0.0–0.2)

## 2022-03-30 LAB — TYPE AND SCREEN
ABO/RH(D): B POS
Antibody Screen: NEGATIVE

## 2022-03-30 LAB — GLUCOSE, CAPILLARY: Glucose-Capillary: 70 mg/dL (ref 70–99)

## 2022-03-30 MED ORDER — ACETAMINOPHEN 325 MG PO TABS: 650.0000 mg | ORAL_TABLET | ORAL | Status: DC | PRN

## 2022-03-30 MED ORDER — OXYTOCIN-SODIUM CHLORIDE 30-0.9 UT/500ML-% IV SOLN
2.5000 [IU]/h | INTRAVENOUS | Status: DC
  Administered 2022-03-31: 2.5 [IU]/h via INTRAVENOUS
  Filled 2022-03-30: qty 500

## 2022-03-30 MED ORDER — FENTANYL-BUPIVACAINE-NACL 0.5-0.125-0.9 MG/250ML-% EP SOLN
12.0000 mL/h | EPIDURAL | Status: DC | PRN
  Administered 2022-03-30: 12 mL/h via EPIDURAL

## 2022-03-30 MED ORDER — PHENYLEPHRINE 80 MCG/ML (10ML) SYRINGE FOR IV PUSH (FOR BLOOD PRESSURE SUPPORT): 80.0000 ug | PREFILLED_SYRINGE | INTRAVENOUS | Status: DC | PRN

## 2022-03-30 MED ORDER — EPHEDRINE 5 MG/ML INJ: 10.0000 mg | INTRAVENOUS | Status: DC | PRN

## 2022-03-30 MED ORDER — OXYTOCIN BOLUS FROM INFUSION
333.0000 mL | Freq: Once | INTRAVENOUS | Status: AC
  Administered 2022-03-31: 333 mL via INTRAVENOUS

## 2022-03-30 MED ORDER — LACTATED RINGERS IV SOLN
500.0000 mL | INTRAVENOUS | Status: DC | PRN
  Administered 2022-03-30: 500 mL via INTRAVENOUS

## 2022-03-30 MED ORDER — LIDOCAINE HCL (PF) 1 % IJ SOLN
INTRAMUSCULAR | Status: AC
  Filled 2022-03-30: qty 30

## 2022-03-30 MED ORDER — LACTATED RINGERS IV SOLN: INTRAVENOUS | Status: DC

## 2022-03-30 MED ORDER — AMMONIA AROMATIC IN INHA
RESPIRATORY_TRACT | Status: AC
  Filled 2022-03-30: qty 10

## 2022-03-30 MED ORDER — MISOPROSTOL 200 MCG PO TABS
ORAL_TABLET | ORAL | Status: AC
  Filled 2022-03-30: qty 4

## 2022-03-30 MED ORDER — SODIUM CHLORIDE 0.9 % IV SOLN
INTRAVENOUS | Status: DC | PRN
  Administered 2022-03-30 (×2): 5 mL via EPIDURAL

## 2022-03-30 MED ORDER — FENTANYL CITRATE (PF) 100 MCG/2ML IJ SOLN: 50.0000 ug | INTRAMUSCULAR | Status: DC | PRN

## 2022-03-30 MED ORDER — LIDOCAINE HCL (PF) 1 % IJ SOLN
INTRAMUSCULAR | Status: DC | PRN
  Administered 2022-03-30: 2 mL via SUBCUTANEOUS

## 2022-03-30 MED ORDER — OXYTOCIN 10 UNIT/ML IJ SOLN
INTRAMUSCULAR | Status: AC
  Filled 2022-03-30: qty 2

## 2022-03-30 MED ORDER — LIDOCAINE-EPINEPHRINE (PF) 1.5 %-1:200000 IJ SOLN
INTRAMUSCULAR | Status: DC | PRN
  Administered 2022-03-30: 3 mL via EPIDURAL

## 2022-03-30 MED ORDER — SOD CITRATE-CITRIC ACID 500-334 MG/5ML PO SOLN: 30.0000 mL | ORAL | Status: DC | PRN

## 2022-03-30 MED ORDER — ONDANSETRON HCL 4 MG/2ML IJ SOLN: 4.0000 mg | Freq: Four times a day (QID) | INTRAMUSCULAR | Status: DC | PRN

## 2022-03-30 MED ORDER — DIPHENHYDRAMINE HCL 50 MG/ML IJ SOLN: 12.5000 mg | INTRAMUSCULAR | Status: DC | PRN

## 2022-03-30 MED ORDER — LIDOCAINE HCL (PF) 1 % IJ SOLN: 30.0000 mL | INTRAMUSCULAR | Status: DC | PRN

## 2022-03-30 MED ORDER — LACTATED RINGERS IV SOLN: 500.0000 mL | Freq: Once | INTRAVENOUS | Status: DC

## 2022-03-30 MED ORDER — FENTANYL-BUPIVACAINE-NACL 0.5-0.125-0.9 MG/250ML-% EP SOLN
EPIDURAL | Status: AC
  Filled 2022-03-30: qty 250

## 2022-03-30 NOTE — Anesthesia Preprocedure Evaluation (Signed)
Anesthesia Evaluation  Patient identified by MRN, date of birth, ID band Patient awake    Reviewed: Allergy & Precautions, H&P , NPO status , Patient's Chart, lab work & pertinent test results, reviewed documented beta blocker date and time   History of Anesthesia Complications Negative for: history of anesthetic complications  Airway Mallampati: II  TM Distance: >3 FB Neck ROM: full    Dental no notable dental hx.    Pulmonary neg pulmonary ROS   Pulmonary exam normal breath sounds clear to auscultation       Cardiovascular Exercise Tolerance: Good negative cardio ROS Normal cardiovascular exam Rhythm:regular Rate:Normal     Neuro/Psych  Headaches, neg Seizures  negative psych ROS   GI/Hepatic negative GI ROS, Neg liver ROS,,,  Endo/Other  diabetes, Well Controlled, Gestational    Renal/GU negative Renal ROS  negative genitourinary   Musculoskeletal   Abdominal   Peds  Hematology negative hematology ROS (+)   Anesthesia Other Findings Past Medical History: No date: Migraines   Reproductive/Obstetrics (+) Pregnancy                             Anesthesia Physical Anesthesia Plan  ASA: 2  Anesthesia Plan: Epidural   Post-op Pain Management:    Induction:   PONV Risk Score and Plan:   Airway Management Planned:   Additional Equipment:   Intra-op Plan:   Post-operative Plan:   Informed Consent: I have reviewed the patients History and Physical, chart, labs and discussed the procedure including the risks, benefits and alternatives for the proposed anesthesia with the patient or authorized representative who has indicated his/her understanding and acceptance.     Dental Advisory Given  Plan Discussed with: Anesthesiologist, CRNA and Surgeon  Anesthesia Plan Comments:         Anesthesia Quick Evaluation

## 2022-03-30 NOTE — H&P (Signed)
OB History & Physical   History of Present Illness:  Chief Complaint: bloody show and pelvic pressure  HPI:  Dana Benton is a 36 y.o. G2P1001 female at [redacted]w[redacted]d dated by LMP and c/w [place] at [redacted]w[redacted]d; EDD 04/04/22.  She presents to L&D for bloody show and increased pelvic pressure since exam earlier today in office. Reports active FM.     Pregnancy Issues: 1. AMA 2. GDMA1 3. Desires PP BTL   Maternal Medical History:   Past Medical History:  Diagnosis Date   Migraines     Past Surgical History:  Procedure Laterality Date   KIDNEY SURGERY     age 78    Allergies  Allergen Reactions   Sulfa Antibiotics Rash    Prior to Admission medications   Medication Sig Start Date End Date Taking? Authorizing Provider  Prenatal Vit-Fe Fumarate-FA (MULTIVITAMIN-PRENATAL) 27-0.8 MG TABS tablet Take 1 tablet by mouth daily at 12 noon.   Yes [provider]  ferrous sulfate 325 (65 FE) MG EC tablet Take 325 mg by mouth 3 (three) times daily with meals.    [provider]  ibuprofen (ADVIL,MOTRIN) 600 MG tablet Take 1 tablet (600 mg total) by mouth every 6 (six) hours. 01/17/16   Karena Addison, CNM     Prenatal care site: [facility]   Social History: She  reports that she has never smoked. She has never used smokeless tobacco. She reports that she does not drink alcohol and does not use drugs.  Family History: family history includes Diabetes in her father; Thyroid disease in her mother.   Review of Systems: A full review of systems was performed and negative except as noted in the HPI.     Physical Exam:  Vital Signs: BP 114/77 (BP Location: Right Arm)   Pulse 72   Temp 98.2 F (36.8 C) (Oral)   Resp 17   Ht 5\' 6"  (1.676 m)   Wt 72.6 kg   BMI 25.82 kg/m  General: no acute distress.  HEENT: normocephalic, atraumatic Heart: regular rate & rhythm.  No murmurs/rubs/gallops Lungs: clear to auscultation bilaterally, normal respiratory effort Abdomen:  soft, gravid, non-tender;  EFW: 7lbs Pelvic:   External: Normal external female genitalia  Cervix: Dilation: 6 / Effacement (%): 80 / Station: 0    Extremities: non-tender, symmetric, no edema bilaterally.  DTRs: 2+  Neurologic: Alert & oriented x 3.    No results found for this or any previous visit (from the past 24 hour(s)).  Pertinent Results:  Prenatal Labs: Blood type/Rh  B Pos  Antibody screen neg  Rubella Immune  Varicella Immune  RPR NR  HBsAg Neg  HIV NR  GC neg  Chlamydia neg  Genetic screening negative  1 hour GTT  160  3 hour GTT 76, 196, 167, 144   GBS  neg   FHT: 135bpm, mod var, + accels, no decels TOCO: q4-60min w/ UI SVE:  Dilation: 6 / Effacement (%): 80 / Station: 0    Cephalic by leopolds/SVE  No results found.  Assessment:  Dana Benton is a 36 y.o. G2P1001 female at [redacted]w[redacted]d with active labor.   Plan:  1. Admit to Labor & Delivery; consents reviewed and obtained - notified Dr [redacted]w[redacted]d of admission.   2. Fetal Well being  - Fetal Tracing: Cat I - Group B Streptococcus ppx indicated: negative - Presentation: cephalic confirmed by exam   3. Routine OB: - Prenatal labs reviewed, as above - Rh B Pos -  CBC, T&S, RPR on admit - Clear fluids, IVF  4. Monitoring of Labor -  Contractions: external toco in place -  Pelvis proven to 2800gm -  Plan for augment with AROM -  Plan for continuous fetal monitoring  -  Maternal pain control as desired; requesting epidural - Anticipate vaginal delivery  5. Post Partum Planning: - Infant feeding: breast and formula - Contraception: BTL desired - Tdap 01/13/22 - Flu- rec'd at work - RSV- missed window  Randa Ngo, CNM 03/30/22 6:43 PM

## 2022-03-30 NOTE — Progress Notes (Signed)
Labor Progress Note  Dana Benton is a 36 y.o. G2P1001 at [redacted]w[redacted]d by LMP admitted for active labor  Subjective: still feeling mostly pressure with UCs.   Objective: BP 119/68 (BP Location: Left Arm)   Pulse 67   Temp 98.3 F (36.8 C) (Oral)   Resp 18   Ht 5\' 6"  (1.676 m)   Wt 72.6 kg   BMI 25.82 kg/m  Notable VS details: reviewed  Fetal Assessment: FHT:  FHR: 140 bpm, variability: moderate,  accelerations:  Present,  decelerations:  Absent Category/reactivity:  Category I UC:   regular, every 1.5-5 minutes with UI SVE:   6/80/0; posterior, soft. AROM performed, small amount bloody show and clear fluid.  Membrane status: AROM at 2000 Amniotic color: clear  Labs: Lab Results  Component Value Date   WBC 11.8 (H) 03/30/2022   HGB 11.0 (L) 03/30/2022   HCT 34.0 (L) 03/30/2022   MCV 91.2 03/30/2022   PLT 227 03/30/2022    Assessment / Plan: Spontaneous labor, progressing normally  Labor: AROM performed Preeclampsia:  no e/o PreE Fetal Wellbeing:  Category I Pain Control:  desires epidural I/D:  GBS Neg Anticipated MOD:  NSVD  14/09/2021 Erin Uecker, CNM 03/30/2022, 9:14 PM

## 2022-03-30 NOTE — Anesthesia Procedure Notes (Signed)
Epidural Patient location during procedure: OB Start time: 03/30/2022 9:04 PM End time: 03/30/2022 9:13 PM  Staffing Anesthesiologist: Lenard Simmer, MD Performed: anesthesiologist   Preanesthetic Checklist Completed: patient identified, IV checked, site marked, risks and benefits discussed, surgical consent, monitors and equipment checked, pre-op evaluation and timeout performed  Epidural Patient position: sitting Prep: ChloraPrep Patient monitoring: heart rate, continuous pulse ox and blood pressure Approach: midline Location: L3-L4 Injection technique: LOR saline  Needle:  Needle type: Tuohy  Needle gauge: 17 G Needle length: 9 cm Needle insertion depth: 5 cm Catheter type: closed end flexible Catheter size: 19 Gauge Catheter at skin depth: 10 cm Test dose: negative and 1.5% lidocaine with Epi 1:200 K  Assessment Sensory level: T10 Events: blood not aspirated, no cerebrospinal fluid, injection not painful, no injection resistance, no paresthesia and negative IV test  Additional Notes 1st attempt Pt. Evaluated and documentation done after procedure finished. Patient identified. Risks/Benefits/Options discussed with patient including but not limited to bleeding, infection, nerve damage, paralysis, failed block, incomplete pain control, headache, blood pressure changes, nausea, vomiting, reactions to medication both or allergic, itching and postpartum back pain. Confirmed with bedside nurse the patient's most recent platelet count. Confirmed with patient that they are not currently taking any anticoagulation, have any bleeding history or any family history of bleeding disorders. Patient expressed understanding and wished to proceed. All questions were answered. Sterile technique was used throughout the entire procedure. Please see nursing notes for vital signs. Test dose was given through epidural catheter and negative prior to continuing to dose epidural or start infusion.  Warning signs of high block given to the patient including shortness of breath, tingling/numbness in hands, complete motor block, or any concerning symptoms with instructions to call for help. Patient was given instructions on fall risk and not to get out of bed. All questions and concerns addressed with instructions to call with any issues or inadequate analgesia.  On first pass, patient had significant paresthesia on the right with attempt at catheter placement, so I pulled back and redirected more to the left.  No further paresthesia with the second pass, and catheter placed without further incident.  Patient tolerated the insertion well without immediate complications.Reason for block:procedure for pain

## 2022-03-31 LAB — CBC
HCT: 31.8 % — ABNORMAL LOW (ref 36.0–46.0)
Hemoglobin: 10.4 g/dL — ABNORMAL LOW (ref 12.0–15.0)
MCH: 30.1 pg (ref 26.0–34.0)
MCHC: 32.7 g/dL (ref 30.0–36.0)
MCV: 92.2 fL (ref 80.0–100.0)
Platelets: 192 10*3/uL (ref 150–400)
RBC: 3.45 MIL/uL — ABNORMAL LOW (ref 3.87–5.11)
RDW: 14.3 % (ref 11.5–15.5)
WBC: 16.7 10*3/uL — ABNORMAL HIGH (ref 4.0–10.5)
nRBC: 0 % (ref 0.0–0.2)

## 2022-03-31 LAB — RPR: RPR Ser Ql: NONREACTIVE

## 2022-03-31 MED ORDER — SIMETHICONE 80 MG PO CHEW: 80.0000 mg | CHEWABLE_TABLET | ORAL | Status: DC | PRN

## 2022-03-31 MED ORDER — ONDANSETRON HCL 4 MG/2ML IJ SOLN: 4.0000 mg | INTRAMUSCULAR | Status: DC | PRN

## 2022-03-31 MED ORDER — ZOLPIDEM TARTRATE 5 MG PO TABS: 5.0000 mg | ORAL_TABLET | Freq: Every evening | ORAL | Status: DC | PRN

## 2022-03-31 MED ORDER — IBUPROFEN 600 MG PO TABS
600.0000 mg | ORAL_TABLET | Freq: Four times a day (QID) | ORAL | Status: DC
  Administered 2022-03-31 – 2022-04-01 (×4): 600 mg via ORAL
  Filled 2022-03-31 (×4): qty 1

## 2022-03-31 MED ORDER — ONDANSETRON HCL 4 MG PO TABS: 4.0000 mg | ORAL_TABLET | ORAL | Status: DC | PRN

## 2022-03-31 MED ORDER — METOCLOPRAMIDE HCL 10 MG PO TABS: 10.0000 mg | ORAL_TABLET | Freq: Once | ORAL | Status: DC

## 2022-03-31 MED ORDER — PRENATAL MULTIVITAMIN CH
1.0000 | ORAL_TABLET | Freq: Every day | ORAL | Status: DC
  Administered 2022-03-31: 1 via ORAL
  Filled 2022-03-31: qty 1

## 2022-03-31 MED ORDER — DIPHENHYDRAMINE HCL 25 MG PO CAPS: 25.0000 mg | ORAL_CAPSULE | Freq: Four times a day (QID) | ORAL | Status: DC | PRN

## 2022-03-31 MED ORDER — DIBUCAINE (PERIANAL) 1 % EX OINT
1.0000 | TOPICAL_OINTMENT | CUTANEOUS | Status: DC | PRN
  Administered 2022-03-31: 1 via RECTAL
  Filled 2022-03-31: qty 28

## 2022-03-31 MED ORDER — FERROUS SULFATE 325 (65 FE) MG PO TABS
325.0000 mg | ORAL_TABLET | Freq: Two times a day (BID) | ORAL | Status: DC
  Administered 2022-03-31 – 2022-04-01 (×3): 325 mg via ORAL
  Filled 2022-03-31 (×3): qty 1

## 2022-03-31 MED ORDER — COCONUT OIL OIL: 1.0000 | TOPICAL_OIL | Status: DC | PRN

## 2022-03-31 MED ORDER — WITCH HAZEL-GLYCERIN EX PADS
1.0000 | MEDICATED_PAD | CUTANEOUS | Status: DC | PRN
  Administered 2022-03-31: 1 via TOPICAL
  Filled 2022-03-31: qty 100

## 2022-03-31 MED ORDER — BENZOCAINE-MENTHOL 20-0.5 % EX AERO
1.0000 | INHALATION_SPRAY | CUTANEOUS | Status: DC | PRN
  Administered 2022-03-31: 1 via TOPICAL
  Filled 2022-03-31: qty 56

## 2022-03-31 MED ORDER — ACETAMINOPHEN 325 MG PO TABS
650.0000 mg | ORAL_TABLET | ORAL | Status: DC | PRN
  Administered 2022-03-31: 650 mg via ORAL
  Filled 2022-03-31: qty 2

## 2022-03-31 MED ORDER — LACTATED RINGERS IV SOLN: INTRAVENOUS | Status: DC

## 2022-03-31 MED ORDER — SENNOSIDES-DOCUSATE SODIUM 8.6-50 MG PO TABS
2.0000 | ORAL_TABLET | Freq: Every day | ORAL | Status: DC
  Filled 2022-03-31: qty 2

## 2022-03-31 MED ORDER — FAMOTIDINE 20 MG PO TABS: 40.0000 mg | ORAL_TABLET | Freq: Once | ORAL | Status: DC

## 2022-03-31 NOTE — Progress Notes (Signed)
Post Partum Day 0 Subjective: Doing well, no complaints.  Tolerating regular diet, pain with PO meds, voiding and ambulating without difficulty.  No CP SOB Fever,Chills, N/V or leg pain; denies nipple or breast pain, no HA change of vision, RUQ/epigastric pain  Objective: BP 114/71 (BP Location: Right Arm)   Pulse 65   Temp 98.1 F (36.7 C) (Oral)   Resp 20   Ht 5\' 6"  (1.676 m)   Wt 72.6 kg   SpO2 100%   BMI 25.82 kg/m    Physical Exam:  General: NAD Breasts: soft/nontender CV: RRR Pulm: nl effort, CTABL Abdomen: soft, NT, BS x 4 Perineum: minimal edema, intact Lochia: moderate Uterine Fundus: fundus firm and 1 fb below umbilicus DVT Evaluation: no cords, ttp LEs   Recent Labs    03/30/22 1902 03/31/22 0652  HGB 11.0* 10.4*  HCT 34.0* 31.8*  WBC 11.8* 16.7*  PLT 227 192    Assessment/Plan: 36 y.o. G2P1001 postpartum day # 0  - Continue routine PP care - Lactation consult PRN - Desires BTL tomorrow, NPO after midnight - Acute blood loss anemia - hemodynamically stable and asymptomatic; start po ferrous sulfate BID with stool softeners  - Immunization status: all Imms up to date  Disposition: Does not desire Dc home today.   31, CNM 03/31/2022 8:48 AM

## 2022-03-31 NOTE — Progress Notes (Signed)
Patient desires to do BTL outpatient instead of during this admission. CNM D. Wilson informed.

## 2022-03-31 NOTE — Discharge Summary (Signed)
Obstetrical Discharge Summary  Patient Name: Dana Benton DOB: 09-18-1985 MRN: [id number]  Date of Admission: 03/30/2022 Date of Delivery: 03/31/22 Delivered by: Dala Dock CNM  Date of Discharge: 04/01/2022  [facility] LMP:No LMP recorded. Patient is pregnant. EDC Estimated Date of Delivery: 04/04/22 Gestational Age at Delivery: [redacted]w[redacted]d   Antepartum complications:  AMA GDMA1 Desires PP BTL  Admitting Diagnosis: Labor and delivery, indication for care [O75.9]  Secondary Diagnosis: SVD  Patient Active Problem List   Diagnosis Date Noted   NSVD (normal spontaneous vaginal delivery) 04/01/2022    Discharge Diagnosis: Term Pregnancy Delivered and GDM A1      Augmentation: AROM Complications: None Intrapartum complications/course: see delivery note Delivery Type: spontaneous vaginal delivery Anesthesia: epidural anesthesia Placenta: spontaneous To Pathology: No  Laceration: none Episiotomy: none Newborn Data: Live born female  Birth Weight:  3050g (6lb11.6oz) APGAR: 8, 10  Newborn Delivery   Birth date/time: 03/31/2022 02:38:00 Delivery type: Vaginal, Spontaneous      Postpartum Procedures: none Edinburgh:     03/31/2022    4:05 PM  Edinburgh Postnatal Depression Scale Screening Tool  I have been able to laugh and see the funny side of things. 0  I have looked forward with enjoyment to things. 0  I have blamed myself unnecessarily when things went wrong. 1  I have been anxious or worried for no good reason. 0  I have felt scared or panicky for no good reason. 0  Things have been getting on top of me. 0  I have been so unhappy that I have had difficulty sleeping. 0  I have felt sad or miserable. 0  I have been so unhappy that I have been crying. 0  The thought of harming myself has occurred to me. 0  Edinburgh Postnatal Depression Scale Total 1     Post partum course:  Patient had an uncomplicated postpartum course.  By time of discharge on  PPD#1, her pain was controlled on oral pain medications; she had appropriate lochia and was ambulating, voiding without difficulty and tolerating regular diet.  She was deemed stable for discharge to home.      Discharge Physical Exam:  BP 104/68 (BP Location: Right Arm)   Pulse 78   Temp 98.4 F (36.9 C) (Oral)   Resp 18   Ht 5\' 6"  (1.676 m)   Wt 72.6 kg   SpO2 97%   BMI 25.82 kg/m   General: NAD CV: RRR Pulm: nl effort ABD: s/nd/nt, fundus firm and below the umbilicus Lochia: moderate Perineum:minimal edema/intact  DVT Evaluation: LE non-ttp, no evidence of DVT on exam.  Hemoglobin  Date Value Ref Range Status  03/31/2022 10.4 (L) 12.0 - 15.0 g/dL Final   HCT  Date Value Ref Range Status  03/31/2022 31.8 (L) 36.0 - 46.0 % Final    Risk assessment for postpartum VTE and prophylactic treatment: Very high risk factors: None High risk factors: None Moderate risk factors: None  Postpartum VTE prophylaxis with LMWH not indicated  Disposition: stable, discharge to home. Baby Feeding: breast and formula feeding Baby Disposition: home with mom  Rh Immune globulin indicated: No Rubella vaccine given: was not indicated Varivax vaccine given: was not indicated Flu vaccine given in AP setting: Yes  Tdap vaccine given in AP setting: Yes   Contraception: bilateral tubal ligation to be scheduled after discharge  Prenatal Labs:  Blood type/Rh  B Pos  Antibody screen neg  Rubella Immune  Varicella Immune  RPR NR  HBsAg Neg  HIV NR  GC neg  Chlamydia neg  Genetic screening negative  1 hour GTT  160  3 hour GTT 76, 196, 167, 144   GBS  neg     Plan:  Dorella Laster was discharged to home in good condition.   Discharge Medications: Allergies as of 04/01/2022       Reactions   Sulfa Antibiotics Rash        Medication List     STOP taking these medications    ferrous sulfate 325 (65 FE) MG EC tablet Replaced by: ferrous sulfate 325 (65 FE) MG  tablet       TAKE these medications    acetaminophen 325 MG tablet Commonly known as: Tylenol Take 2 tablets (650 mg total) by mouth every 6 (six) hours as needed (for pain scale < 4).   ferrous sulfate 325 (65 FE) MG tablet Take 1 tablet (325 mg total) by mouth daily with breakfast. Replaces: ferrous sulfate 325 (65 FE) MG EC tablet   ibuprofen 600 MG tablet Commonly known as: ADVIL Take 1 tablet (600 mg total) by mouth every 6 (six) hours.   multivitamin-prenatal 27-0.8 MG Tabs tablet Take 1 tablet by mouth daily at 12 noon.         Follow-up Information     McVey, Prudencio Pair, CNM Follow up.   Specialty: Obstetrics and Gynecology Why: 6wk POstpartum visit Contact information: [address] [place] [address] [phone]         Christeen Douglas, MD. Schedule an appointment as soon as possible for a visit in 2 week(s).   Specialty: Obstetrics and Gynecology Why: to discuss BTL Contact information: [address] [place] [address] [phone]                 Signed: Cyril Mourning 04/01/2022 8:51 AM

## 2022-03-31 NOTE — Plan of Care (Signed)
Care plan complete

## 2022-03-31 NOTE — Lactation Note (Signed)
This note was copied from a baby's chart. Lactation Consultation Note  Patient Name: Dana Benton [id number] Date: 03/31/2022 Reason for consult: Initial assessment;Term Age:35 hours  Maternal Data Has patient been taught Hand Expression?: Yes Does the patient have breastfeeding experience prior to this delivery?: Yes How long did the patient breastfeed?: 3 months  Mom is P2, SVD 6 hours ago. No pertinent medical history, planning BTL tomorrow morning. Some breastfeeding experience with first child (now 82yrs old). Breastfed for 3 months before transitioning to bottle; initial nipple pain/discomfort.  Feeding Mother's Current Feeding Choice: Breast Milk  Baby fed for an hour post delivery, and was beginning to feed upon entry. Mom notes uterine cramping/discomfort- educated on indication that baby has good latch and transferring colostrum.  LATCH Score Latch: Repeated attempts needed to sustain latch, nipple held in mouth throughout feeding, stimulation needed to elicit sucking reflex.  Audible Swallowing: A few with stimulation  Type of Nipple: Everted at rest and after stimulation  Comfort (Breast/Nipple): Soft / non-tender  Hold (Positioning): No assistance needed to correctly position infant at breast.  LATCH Score: 8  Baby positioned well, on/off breast at first before sustaining the latch. Once sustained baby did well at the breast, no discomfort noted at this time by mom.  Lactation Tools Discussed/Used    Interventions Interventions: Breast feeding basics reviewed;Hand express;Support pillows;Education  Basic BF education given, newborn feeding patterns/behaviors and early cues reviewed. Tips given for keeping baby awake/alert at the breast. Reminder of 8-12 attempts in first 24hrs, but baby may not eat every time. Reviewed hand expression.  Discharge Pump: Manual [facility])  Discussed expression options. Mom has a haaka, cautioned on early use. Prefers the hand  expression and manual options over the electric options. Provided education and guidance on implementation, milk storage, cleaning of items.  Consult Status Consult Status: Follow-up  Updated board; encouraged to call as needed for support or with questions.  Danford Bad 03/31/2022, 9:34 AM

## 2022-04-01 ENCOUNTER — Encounter
Admission: EM | Disposition: A | Discharge status: Home / Self Care | Payer: Self-pay | Source: Home / Self Care | Attending: Obstetrics and Gynecology

## 2022-04-01 ENCOUNTER — Encounter: Payer: Self-pay | Admitting: Obstetrics and Gynecology

## 2022-04-01 SURGERY — LIGATION, FALLOPIAN TUBE, BILATERAL
Anesthesia: Choice

## 2022-04-01 MED ORDER — FERROUS SULFATE 325 (65 FE) MG PO TABS: 325.0000 mg | ORAL_TABLET | Freq: Every day | ORAL | Status: DC

## 2022-04-01 MED ORDER — ACETAMINOPHEN 325 MG PO TABS: 650.0000 mg | ORAL_TABLET | Freq: Four times a day (QID) | ORAL | Status: AC | PRN

## 2022-04-01 NOTE — Progress Notes (Signed)
Patient discharged home with family.  Discharge instructions, when to follow up, and prescriptions reviewed with patient.  Patient verbalized understanding. Patient will be escorted out by auxiliary.   

## 2022-04-01 NOTE — Lactation Note (Signed)
This note was copied from a baby's chart. Lactation Consultation Note  Patient Name: Girl Viana Sleep YIFOY'D Date: 04/01/2022 Reason for consult: Follow-up assessment;Mother's request;Term Age:35 hours  Maternal Data Has patient been taught Hand Expression?: Yes Does the patient have breastfeeding experience prior to this delivery?: Yes  Feeding Mother's Current Feeding Choice: Breast Milk  LATCH Score Latch: Grasps breast easily, tongue down, lips flanged, rhythmical sucking.  Audible Swallowing: A few with stimulation  Type of Nipple: Everted at rest and after stimulation  Comfort (Breast/Nipple): Filling, red/small blisters or bruises, mild/mod discomfort  Hold (Positioning): No assistance needed to correctly position infant at breast.  LATCH Score: 8   Lactation Tools Discussed/Used    Interventions Interventions: Breast feeding basics reviewed;Assisted with latch;Breast compression;Comfort gels (lanolin) LC assessed latch in cradle hold on right and left side. Nipple slightly flattened on right side when baby unlatched. Lanolin applied. Demonstrated ways to keep baby awake and active throughout feed and to unlatch when no more swallows heard. Lips flanged at breast and can lateralize tongue to both sides but only slight extension past lower gumline. Soreness at the beginning of the feeding but eased after initial latch.  Answered questions about formula prep and amounts to give per age if formula use is initiated.   Discharge    Consult Status      Matt Holmes 04/01/2022, 12:44 AM

## 2022-04-01 NOTE — Anesthesia Postprocedure Evaluation (Signed)
Anesthesia Post Note  Patient: Tameah Mihalko  Procedure(s) Performed: AN AD HOC LABOR EPIDURAL  Patient location during evaluation: Mother Baby Anesthesia Type: Epidural Level of consciousness: oriented and awake and alert Pain management: pain level controlled Vital Signs Assessment: post-procedure vital signs reviewed and stable Respiratory status: spontaneous breathing and respiratory function stable Cardiovascular status: blood pressure returned to baseline and stable Postop Assessment: no headache, no backache, no apparent nausea or vomiting and able to ambulate Anesthetic complications: no  No notable events documented.   Last Vitals:  Vitals:   03/31/22 1601 03/31/22 2332  BP: 115/68 (!) 102/59  Pulse: 70 74  Resp: 18 18  Temp: 36.7 C 36.8 C  SpO2:  98%    Last Pain:  Vitals:   04/01/22 0000  TempSrc:   PainSc: 5                  Starling Manns

## 2022-04-01 NOTE — Discharge Instructions (Signed)

## 2022-04-26 ENCOUNTER — Encounter: Payer: Self-pay | Admitting: Obstetrics and Gynecology

## 2022-04-29 ENCOUNTER — Other Ambulatory Visit: Payer: Self-pay | Admitting: Obstetrics and Gynecology

## 2022-05-16 NOTE — H&P (Signed)
Chief Complaint:   Dana Benton is a 37 y.o. female G2P2002 here for Follow-up (2 week postpartum follow up - discuss BTL) . History of Present Illness: Preop Visit for Interval BTL   The patient is postpartum from a SVD delivery on 03/31/22 with RAM. She presents to discuss permanent sterilization. In her own words, " ready but wants more details."   Pertinent Hx: - SVD x 2 - Last pap 07/2019 neg/neg   Past Medical History:  has a past medical history of Migraine headache and Seasonal allergic rhinitis.  Past Surgical History:  has a past surgical history that includes kidney surgery (1993). Family History: family history includes Cancer in her maternal grandmother; Diabetes in her father, paternal grandfather, and paternal grandmother; High blood pressure (Hypertension) in her sister; Migraines in her father and mother. Social History:  reports that she has never smoked. She has never used smokeless tobacco. She reports that she does not drink alcohol and does not use drugs. OB/GYN History:   OB History        Gravida 2   Para 2   Term 2   Preterm     AB     Living 2        SAB     IAB     Ectopic     Molar     Multiple     Live Births 2           Allergies: is allergic to sulfa (sulfonamide antibiotics). Medications:   Current Outpatient Medications:    acetaminophen (TYLENOL) 325 MG tablet, Take by mouth, Disp: , Rfl:    cetirizine (ZYRTEC) 10 MG tablet, Take 10 mg by mouth once daily as needed for Allergies, Disp: , Rfl:    prenatal vit-iron fum-folic ac (PRENAVITE) tablet, Take 1 tablet by mouth once daily, Disp: , Rfl:    blood glucose diagnostic test strip, 1 each (1 strip total) 4 (four) times daily Use as instructed. (Patient not taking: Reported on 04/19/2022), Disp: 100 strip, Rfl: 6   blood glucose meter kit, as directed (Patient not taking: Reported on 04/19/2022), Disp: 1 each, Rfl: 0   blood-glucose sensor (FREESTYLE  LIBRE 3 SENSOR MISC), Use Continous checking (Patient not taking: Reported on 04/19/2022), Disp: , Rfl:    clobetasoL (TEMOVATE) 0.05 % cream, Apply topically 2 (two) times daily as needed (Patient not taking: Reported on 01/26/2022), Disp: , Rfl:    ferrous sulfate 325 (65 FE) MG tablet, Take 325 mg by mouth daily with breakfast (Patient not taking: Reported on 04/19/2022), Disp: , Rfl:    lancets, Use 1 each 4 (four) times daily Use as instructed. (Patient not taking: Reported on 04/19/2022), Disp: 100 each, Rfl: 12   Review of Systems: No SOB, no palpitations or chest pain, no new lower extremity edema, no nausea or vomiting or bowel or bladder complaints. See HPI for gyn specific ROS.     Exam:   BP 93/62   Pulse (!) 114   Ht 167.6 cm (5\' 6" )   Wt 63.6 kg (140 lb 3.2 oz)   LMP 06/28/2021 (Exact Date)   Breastfeeding Yes Comment: breast/bottle  BMI 22.63 kg/m    General: Patient is well-groomed, well-nourished, appears stated age in no acute distress   HEENT: head is atraumatic and normocephalic, trachea is midline, neck is supple with no palpable nodules   CV: Regular rhythm and normal heart rate, no murmur   Pulm: Clear to auscultation  throughout lung fields with no wheezing, crackles, or rhonchi. No increased work of breathing   Abdomen: soft , no mass, non-tender, no rebound tenderness, no hepatomegaly   Pelvic:  Deferred    Impression:   The encounter diagnosis was Unwanted fertility.    Plan:   1. Request for Permanent Sterilization  -Patient desires surgical sterilization.  Patient has been counseled on alternate forms of contraception including hormonal forms, IUD's and barrier methods. She has been counseled on risks of surgical sterilization including bleeding, infection, pain, injury during procedure, risk of need for further procedures/surgeries due to injury or abnormalities at the time of surgery, thromboembolic events, exacerbation of ongoing medical  conditions, risk of ectopic pregnancy, risk of failure of procedure to prevent pregnancy, medication reactions as well as the risk of anesthesia.  Patient verbalizes understanding.  Consent form signed.  Preoperative and postoperative instructions provided. Written and verbal education provided.  No barriers to learning.     Return for Postop check.

## 2022-05-23 ENCOUNTER — Telehealth: Payer: Self-pay

## 2022-05-23 NOTE — Telephone Encounter (Signed)
[facility]- Discharge Call Backs-Voicemail left for pt. 1-Do you have any questions or concerns about yourself as you heal? 2-Any concerns or questions about your baby? 3- Reviewed ABC's of safe sleep. 4-How was your stay at the hospital? 5-How did our team work together to care for you? You should be receiving a survey in the mail soon.   We would really appreciate it if you could fill that out for [place] and return it in the mail.  We value the feedback to make improvements and continue the great work we do.   If you have any questions please feel free to call me back at [phone]

## 2022-05-25 ENCOUNTER — Encounter
Admission: RE | Admit: 2022-05-25 | Discharge: 2022-05-25 | Disposition: A | Discharge status: Home / Self Care | Payer: 59 | Source: Ambulatory Visit | Attending: Obstetrics and Gynecology | Admitting: Obstetrics and Gynecology

## 2022-05-25 ENCOUNTER — Other Ambulatory Visit: Payer: Self-pay

## 2022-05-25 DIAGNOSIS — Z01812 Encounter for preprocedural laboratory examination: Secondary | ICD-10-CM

## 2022-05-25 DIAGNOSIS — O24414 Gestational diabetes mellitus in pregnancy, insulin controlled: Secondary | ICD-10-CM

## 2022-05-25 HISTORY — DX: Type 2 diabetes mellitus without complications: E11.9

## 2022-05-25 NOTE — Patient Instructions (Addendum)
Your procedure is scheduled on: 06/03/22 - Friday Report to the Registration Desk on the 1st floor of the [facility]. To find out your arrival time, please call [phone] between 1PM - 3PM on: 06/02/22 - Thursday If your arrival time is 6:00 am, do not arrive prior to that time as the [facility] entrance doors do not open until 6:00 am.  REMEMBER: Instructions that are not followed completely may result in serious medical risk, up to and including death; or upon the discretion of your surgeon and anesthesiologist your surgery may need to be rescheduled.  Do not eat food after midnight the night before surgery.  No gum chewing, lozengers or hard candies.  You may however, drink CLEAR liquids up to 2 hours before you are scheduled to arrive for your surgery. Do not drink anything within 2 hours of your scheduled arrival time.  Clear liquids include: - water  - apple juice without pulp - gatorade (not RED colors) - black coffee or tea (Do NOT add milk or creamers to the coffee or tea) Do NOT drink anything that is not on this list.  TAKE THESE MEDICATIONS THE MORNING OF SURGERY WITH A SIP OF WATER: NONE  One week prior to surgery: Stop Anti-inflammatories (NSAIDS) such as Advil, Aleve, Ibuprofen, Motrin, Naproxen, Naprosyn and Aspirin based products such as Excedrin, Goodys Powder, BC Powder.  Stop ANY OVER THE COUNTER supplements until after surgery.  You may however, continue to take Tylenol if needed for pain up until the day of surgery.  No Alcohol for 24 hours before or after surgery.  No Smoking including e-cigarettes for 24 hours prior to surgery.  No chewable tobacco products for at least 6 hours prior to surgery.  No nicotine patches on the day of surgery.  Do not use any "recreational" drugs for at least a week prior to your surgery.  Please be advised that the combination of cocaine and anesthesia may have negative outcomes, up to and including death. If you test  positive for cocaine, your surgery will be cancelled.  On the morning of surgery brush your teeth with toothpaste and water, you may rinse your mouth with mouthwash if you wish. Do not swallow any toothpaste or mouthwash.  Use CHG Soap or wipes as directed on instruction sheet.  Do not wear jewelry, make-up, hairpins, clips or nail polish.  Do not wear lotions, powders, or perfumes.   Do not shave body from the neck down 48 hours prior to surgery just in case you cut yourself which could leave a site for infection.  Also, freshly shaved skin may become irritated if using the CHG soap.  Contact lenses, hearing aids and dentures may not be worn into surgery.  Do not bring valuables to the hospital. [facility] is not responsible for any missing/lost belongings or valuables.   Notify your doctor if there is any change in your medical condition (cold, fever, infection).  Wear comfortable clothing (specific to your surgery type) to the hospital.  After surgery, you can help prevent lung complications by doing breathing exercises.  Take deep breaths and cough every 1-2 hours. Your doctor may order a device called an Incentive Spirometer to help you take deep breaths. When coughing or sneezing, hold a pillow firmly against your incision with both hands. This is called "splinting." Doing this helps protect your incision. It also decreases belly discomfort.  If you are being admitted to the hospital overnight, leave your suitcase in the car.  After surgery it may be brought to your room.  If you are being discharged the day of surgery, you will not be allowed to drive home. You will need a responsible adult (18 years or older) to drive you home and stay with you that night.   If you are taking public transportation, you will need to have a responsible adult (18 years or older) with you. Please confirm with your physician that it is acceptable to use public transportation.   Please call the  [facility] Dept. at [phone] if you have any questions about these instructions.  Surgery Visitation Policy:  Patients undergoing a surgery or procedure may have two family members or support persons with them as long as the person is not COVID-19 positive or experiencing its symptoms.   Inpatient Visitation:    Visiting hours are 7 a.m. to 8 p.m. Up to four visitors are allowed at one time in a patient room. The visitors may rotate out with other people during the day. One designated support person (adult) may remain overnight.  Due to an increase in RSV and influenza rates and associated hospitalizations, children ages 43 and under will not be able to visit patients in [facility]. Masks continue to be strongly recommended.

## 2022-05-27 ENCOUNTER — Encounter
Admission: RE | Admit: 2022-05-27 | Discharge: 2022-05-27 | Disposition: A | Discharge status: Home / Self Care | Payer: Managed Care, Other (non HMO) | Source: Ambulatory Visit | Attending: Obstetrics and Gynecology | Admitting: Obstetrics and Gynecology

## 2022-05-27 DIAGNOSIS — Z01812 Encounter for preprocedural laboratory examination: Secondary | ICD-10-CM | POA: Insufficient documentation

## 2022-05-27 DIAGNOSIS — Z3009 Encounter for other general counseling and advice on contraception: Secondary | ICD-10-CM | POA: Insufficient documentation

## 2022-05-27 LAB — CBC
HCT: 40.5 % (ref 36.0–46.0)
Hemoglobin: 12.7 g/dL (ref 12.0–15.0)
MCH: 29.3 pg (ref 26.0–34.0)
MCHC: 31.4 g/dL (ref 30.0–36.0)
MCV: 93.5 fL (ref 80.0–100.0)
Platelets: 361 10*3/uL (ref 150–400)
RBC: 4.33 MIL/uL (ref 3.87–5.11)
RDW: 13.6 % (ref 11.5–15.5)
WBC: 6.3 10*3/uL (ref 4.0–10.5)
nRBC: 0 % (ref 0.0–0.2)

## 2022-05-27 LAB — TYPE AND SCREEN
ABO/RH(D): B POS
Antibody Screen: NEGATIVE
Extend sample reason: UNDETERMINED

## 2022-05-27 LAB — BASIC METABOLIC PANEL
Anion gap: 8 (ref 5–15)
BUN: 15 mg/dL (ref 6–20)
CO2: 29 mmol/L (ref 22–32)
Calcium: 9.2 mg/dL (ref 8.9–10.3)
Chloride: 102 mmol/L (ref 98–111)
Creatinine, Ser: 1.1 mg/dL — ABNORMAL HIGH (ref 0.44–1.00)
GFR, Estimated: >60 mL/min (ref >60)
Glucose, Bld: 94 mg/dL (ref 70–99)
Potassium: 3.7 mmol/L (ref 3.5–5.1)
Sodium: 139 mmol/L (ref 135–145)

## 2022-06-03 DIAGNOSIS — Z3009 Encounter for other general counseling and advice on contraception: Secondary | ICD-10-CM

## 2022-06-10 ENCOUNTER — Encounter
Admission: RE | Admit: 2022-06-10 | Discharge: 2022-06-10 | Disposition: A | Discharge status: Home / Self Care | Payer: Managed Care, Other (non HMO) | Source: Ambulatory Visit | Attending: Obstetrics and Gynecology | Admitting: Obstetrics and Gynecology

## 2022-06-10 DIAGNOSIS — Z01812 Encounter for preprocedural laboratory examination: Secondary | ICD-10-CM

## 2022-06-10 NOTE — Pre-Procedure Instructions (Addendum)
Medications, allergies and history reviewed with changes updated. Patients original surgery was cancelled, she has up to date labs, her instructions and soap from original appointment. She is reminded to call the day before her surgery for her arrival time. No questions at this time.

## 2022-06-13 NOTE — Anesthesia Preprocedure Evaluation (Addendum)
Anesthesia Evaluation  Patient identified by MRN, date of birth, ID band Patient awake    Reviewed: Allergy & Precautions, H&P , NPO status , Patient's Chart, lab work & pertinent test results  Airway Mallampati: II  TM Distance: >3 FB Neck ROM: full    Dental no notable dental hx.    Pulmonary neg pulmonary ROS   Pulmonary exam normal        Cardiovascular negative cardio ROS Normal cardiovascular exam     Neuro/Psych negative neurological ROS  negative psych ROS   GI/Hepatic negative GI ROS, Neg liver ROS,,,  Endo/Other  diabetes  Gestational DM  Renal/GU      Musculoskeletal   Abdominal Normal abdominal exam  (+)   Peds  Hematology negative hematology ROS (+)   Anesthesia Other Findings Past Medical History: No date: Diabetes mellitus without complication (HCC)     Comment:  gestational No date: Migraines  Past Surgical History: No date: KIDNEY SURGERY     Comment:  age 57     Reproductive/Obstetrics negative OB ROS                             Anesthesia Physical Anesthesia Plan  ASA: 1  Anesthesia Plan: General ETT   Post-op Pain Management: Tylenol PO (pre-op)*, Gabapentin PO (pre-op)* and Toradol IV (intra-op)*   Induction: Intravenous  PONV Risk Score and Plan: 4 or greater and Ondansetron, Dexamethasone, Midazolam and Propofol infusion  Airway Management Planned: Oral ETT  Additional Equipment:   Intra-op Plan:   Post-operative Plan: Extubation in OR  Informed Consent:      Dental Advisory Given  Plan Discussed with: CRNA and Surgeon  Anesthesia Plan Comments:         Anesthesia Quick Evaluation

## 2022-06-14 ENCOUNTER — Encounter: Payer: Self-pay | Admitting: Obstetrics and Gynecology

## 2022-06-14 ENCOUNTER — Ambulatory Visit: Payer: Managed Care, Other (non HMO) | Admitting: Anesthesiology

## 2022-06-14 ENCOUNTER — Ambulatory Visit
Admission: RE | Admit: 2022-06-14 | Discharge: 2022-06-14 | Disposition: A | Discharge status: Home / Self Care | Payer: Managed Care, Other (non HMO) | Attending: Obstetrics and Gynecology | Admitting: Obstetrics and Gynecology

## 2022-06-14 ENCOUNTER — Ambulatory Visit: Payer: Managed Care, Other (non HMO) | Admitting: Urgent Care

## 2022-06-14 ENCOUNTER — Other Ambulatory Visit: Payer: Self-pay

## 2022-06-14 ENCOUNTER — Encounter
Admission: RE | Disposition: A | Discharge status: Home / Self Care | Payer: Self-pay | Source: Home / Self Care | Attending: Obstetrics and Gynecology

## 2022-06-14 DIAGNOSIS — Z302 Encounter for sterilization: Secondary | ICD-10-CM | POA: Diagnosis not present

## 2022-06-14 DIAGNOSIS — Z01812 Encounter for preprocedural laboratory examination: Secondary | ICD-10-CM

## 2022-06-14 DIAGNOSIS — O24414 Gestational diabetes mellitus in pregnancy, insulin controlled: Secondary | ICD-10-CM

## 2022-06-14 DIAGNOSIS — Z3009 Encounter for other general counseling and advice on contraception: Secondary | ICD-10-CM

## 2022-06-14 HISTORY — DX: Gestational diabetes mellitus in pregnancy, unspecified control: O24.419

## 2022-06-14 HISTORY — PX: LAPAROSCOPIC TUBAL LIGATION: SHX1937

## 2022-06-14 LAB — GLUCOSE, CAPILLARY
Glucose-Capillary: 78 mg/dL (ref 70–99)
Glucose-Capillary: 81 mg/dL (ref 70–99)

## 2022-06-14 LAB — TYPE AND SCREEN
ABO/RH(D): B POS
Antibody Screen: NEGATIVE

## 2022-06-14 LAB — POCT PREGNANCY, URINE: Preg Test, Ur: NEGATIVE

## 2022-06-14 SURGERY — LIGATION, FALLOPIAN TUBE, LAPAROSCOPIC
Anesthesia: General | Laterality: Bilateral

## 2022-06-14 MED ORDER — ACETAMINOPHEN 10 MG/ML IV SOLN
INTRAVENOUS | Status: AC
  Filled 2022-06-14: qty 100

## 2022-06-14 MED ORDER — HYDROMORPHONE HCL 1 MG/ML IJ SOLN
INTRAMUSCULAR | Status: AC
  Filled 2022-06-14: qty 1

## 2022-06-14 MED ORDER — LACTATED RINGERS IV SOLN: INTRAVENOUS | Status: DC

## 2022-06-14 MED ORDER — FENTANYL CITRATE (PF) 100 MCG/2ML IJ SOLN
INTRAMUSCULAR | Status: DC | PRN
  Administered 2022-06-14 (×2): 50 ug via INTRAVENOUS

## 2022-06-14 MED ORDER — PROPOFOL 10 MG/ML IV BOLUS
INTRAVENOUS | Status: DC | PRN
  Administered 2022-06-14: 50 ug/kg/min via INTRAVENOUS
  Administered 2022-06-14: 100 mg via INTRAVENOUS

## 2022-06-14 MED ORDER — PHENYLEPHRINE HCL-NACL 20-0.9 MG/250ML-% IV SOLN
INTRAVENOUS | Status: AC
  Filled 2022-06-14: qty 250

## 2022-06-14 MED ORDER — DEXAMETHASONE SODIUM PHOSPHATE 10 MG/ML IJ SOLN
INTRAMUSCULAR | Status: DC | PRN
  Administered 2022-06-14: 10 mg via INTRAVENOUS

## 2022-06-14 MED ORDER — MIDAZOLAM HCL 2 MG/2ML IJ SOLN
INTRAMUSCULAR | Status: AC
  Filled 2022-06-14: qty 2

## 2022-06-14 MED ORDER — ACETAMINOPHEN 500 MG PO TABS
ORAL_TABLET | ORAL | Status: AC
  Filled 2022-06-14: qty 2

## 2022-06-14 MED ORDER — PROMETHAZINE HCL 25 MG/ML IJ SOLN: 6.2500 mg | INTRAMUSCULAR | Status: DC | PRN

## 2022-06-14 MED ORDER — FAMOTIDINE 20 MG PO TABS
ORAL_TABLET | ORAL | Status: AC
  Filled 2022-06-14: qty 1

## 2022-06-14 MED ORDER — DROPERIDOL 2.5 MG/ML IJ SOLN: 0.6250 mg | Freq: Once | INTRAMUSCULAR | Status: DC | PRN

## 2022-06-14 MED ORDER — BUPIVACAINE HCL 0.5 % IJ SOLN
INTRAMUSCULAR | Status: DC | PRN
  Administered 2022-06-14: 10 mL

## 2022-06-14 MED ORDER — ONDANSETRON HCL 4 MG/2ML IJ SOLN
INTRAMUSCULAR | Status: DC | PRN
  Administered 2022-06-14: 4 mg via INTRAVENOUS

## 2022-06-14 MED ORDER — KETOROLAC TROMETHAMINE 15 MG/ML IJ SOLN
INTRAMUSCULAR | Status: AC
  Administered 2022-06-14: 15 mg
  Filled 2022-06-14: qty 1

## 2022-06-14 MED ORDER — OXYCODONE HCL 5 MG PO TABS: 5.0000 mg | ORAL_TABLET | ORAL | 0 refills | Status: DC | PRN

## 2022-06-14 MED ORDER — IBUPROFEN 800 MG PO TABS: 800.0000 mg | ORAL_TABLET | Freq: Three times a day (TID) | ORAL | 1 refills | Status: AC

## 2022-06-14 MED ORDER — ORAL CARE MOUTH RINSE: 15.0000 mL | Freq: Once | OROMUCOSAL | Status: AC

## 2022-06-14 MED ORDER — HYDROMORPHONE HCL 1 MG/ML IJ SOLN
INTRAMUSCULAR | Status: DC | PRN
  Administered 2022-06-14: .2 mg via INTRAVENOUS
  Administered 2022-06-14: .3 mg via INTRAVENOUS

## 2022-06-14 MED ORDER — ACETAMINOPHEN 500 MG PO TABS
1000.0000 mg | ORAL_TABLET | ORAL | Status: AC
  Administered 2022-06-14: 1000 mg via ORAL

## 2022-06-14 MED ORDER — PROPOFOL 1000 MG/100ML IV EMUL
INTRAVENOUS | Status: AC
  Filled 2022-06-14: qty 100

## 2022-06-14 MED ORDER — ACETAMINOPHEN EXTRA STRENGTH 500 MG PO TABS: 1000.0000 mg | ORAL_TABLET | Freq: Four times a day (QID) | ORAL | 0 refills | Status: AC

## 2022-06-14 MED ORDER — CHLORHEXIDINE GLUCONATE 0.12 % MT SOLN
15.0000 mL | Freq: Once | OROMUCOSAL | Status: AC
  Administered 2022-06-14: 15 mL via OROMUCOSAL

## 2022-06-14 MED ORDER — GABAPENTIN 300 MG PO CAPS
ORAL_CAPSULE | ORAL | Status: AC
  Administered 2022-06-14: 300 mg via ORAL
  Filled 2022-06-14: qty 1

## 2022-06-14 MED ORDER — KETOROLAC TROMETHAMINE 30 MG/ML IJ SOLN: 30.0000 mg | Freq: Once | INTRAMUSCULAR | Status: DC | PRN

## 2022-06-14 MED ORDER — PROPOFOL 10 MG/ML IV BOLUS
INTRAVENOUS | Status: AC
  Filled 2022-06-14: qty 40

## 2022-06-14 MED ORDER — FENTANYL CITRATE (PF) 100 MCG/2ML IJ SOLN: 25.0000 ug | INTRAMUSCULAR | Status: DC | PRN

## 2022-06-14 MED ORDER — GLYCOPYRROLATE 0.2 MG/ML IJ SOLN
INTRAMUSCULAR | Status: DC | PRN
  Administered 2022-06-14: .2 mg via INTRAVENOUS

## 2022-06-14 MED ORDER — SUGAMMADEX SODIUM 200 MG/2ML IV SOLN
INTRAVENOUS | Status: DC | PRN
  Administered 2022-06-14: 100 mg via INTRAVENOUS
  Administered 2022-06-14: 200 mg via INTRAVENOUS
  Administered 2022-06-14: 100 mg via INTRAVENOUS

## 2022-06-14 MED ORDER — LIDOCAINE HCL (CARDIAC) PF 100 MG/5ML IV SOSY
PREFILLED_SYRINGE | INTRAVENOUS | Status: DC | PRN
  Administered 2022-06-14: 100 mg via INTRAVENOUS

## 2022-06-14 MED ORDER — KETOROLAC TROMETHAMINE 30 MG/ML IJ SOLN: 15.0000 mg | Freq: Once | INTRAMUSCULAR | Status: DC | PRN

## 2022-06-14 MED ORDER — MIDAZOLAM HCL 2 MG/2ML IJ SOLN
INTRAMUSCULAR | Status: DC | PRN
  Administered 2022-06-14 (×2): 1 mg via INTRAVENOUS

## 2022-06-14 MED ORDER — BUPIVACAINE HCL (PF) 0.5 % IJ SOLN
INTRAMUSCULAR | Status: AC
  Filled 2022-06-14: qty 30

## 2022-06-14 MED ORDER — FENTANYL CITRATE (PF) 100 MCG/2ML IJ SOLN
INTRAMUSCULAR | Status: AC
  Filled 2022-06-14: qty 2

## 2022-06-14 MED ORDER — EPHEDRINE SULFATE (PRESSORS) 50 MG/ML IJ SOLN
INTRAMUSCULAR | Status: DC | PRN
  Administered 2022-06-14 (×2): 10 mg via INTRAVENOUS

## 2022-06-14 MED ORDER — OXYCODONE HCL 5 MG PO TABS: 5.0000 mg | ORAL_TABLET | Freq: Once | ORAL | Status: DC | PRN

## 2022-06-14 MED ORDER — KETOROLAC TROMETHAMINE 30 MG/ML IJ SOLN
INTRAMUSCULAR | Status: DC | PRN
  Administered 2022-06-14: 15 mg via INTRAVENOUS

## 2022-06-14 MED ORDER — FAMOTIDINE 20 MG PO TABS
20.0000 mg | ORAL_TABLET | Freq: Once | ORAL | Status: AC
  Administered 2022-06-14: 20 mg via ORAL

## 2022-06-14 MED ORDER — GABAPENTIN 300 MG PO CAPS: 300.0000 mg | ORAL_CAPSULE | ORAL | Status: AC

## 2022-06-14 MED ORDER — OXYCODONE HCL 5 MG/5ML PO SOLN: 5.0000 mg | Freq: Once | ORAL | Status: DC | PRN

## 2022-06-14 MED ORDER — DOCUSATE SODIUM 100 MG PO CAPS: 100.0000 mg | ORAL_CAPSULE | Freq: Two times a day (BID) | ORAL | 0 refills | Status: DC

## 2022-06-14 MED ORDER — PHENYLEPHRINE 80 MCG/ML (10ML) SYRINGE FOR IV PUSH (FOR BLOOD PRESSURE SUPPORT)
PREFILLED_SYRINGE | INTRAVENOUS | Status: DC | PRN
  Administered 2022-06-14: 80 ug via INTRAVENOUS

## 2022-06-14 MED ORDER — LACTATED RINGERS IV SOLN: INTRAVENOUS | Status: DC | PRN

## 2022-06-14 MED ORDER — 0.9 % SODIUM CHLORIDE (POUR BTL) OPTIME
TOPICAL | Status: DC | PRN
  Administered 2022-06-14: 500 mL

## 2022-06-14 MED ORDER — ROCURONIUM BROMIDE 100 MG/10ML IV SOLN
INTRAVENOUS | Status: DC | PRN
  Administered 2022-06-14: 20 mg via INTRAVENOUS
  Administered 2022-06-14: 40 mg via INTRAVENOUS

## 2022-06-14 MED ORDER — GABAPENTIN 300 MG PO CAPS: 300.0000 mg | ORAL_CAPSULE | Freq: Every day | ORAL | 1 refills | Status: DC

## 2022-06-14 MED ORDER — CHLORHEXIDINE GLUCONATE 0.12 % MT SOLN
OROMUCOSAL | Status: AC
  Filled 2022-06-14: qty 15

## 2022-06-14 SURGICAL SUPPLY — 34 items
BLADE SURG SZ11 CARB STEEL (BLADE) ×1 IMPLANT
CATH ROBINSON RED A/P 16FR (CATHETERS) ×1 IMPLANT
DERMABOND ADVANCED .7 DNX12 (GAUZE/BANDAGES/DRESSINGS) ×1 IMPLANT
DRAPE UTILITY 15X26 TOWEL STRL (DRAPES) ×2 IMPLANT
GAUZE 4X4 16PLY ~~LOC~~+RFID DBL (SPONGE) IMPLANT
GLOVE BIO SURGEON STRL SZ7 (GLOVE) ×2 IMPLANT
GLOVE SURG UNDER LTX SZ7.5 (GLOVE) ×2 IMPLANT
GOWN STRL REUS W/ TWL LRG LVL3 (GOWN DISPOSABLE) ×2 IMPLANT
GOWN STRL REUS W/TWL LRG LVL3 (GOWN DISPOSABLE) ×2
GRASPER SUT TROCAR 14GX15 (MISCELLANEOUS) IMPLANT
KIT PINK PAD W/HEAD ARE REST (MISCELLANEOUS) ×1
KIT PINK PAD W/HEAD ARM REST (MISCELLANEOUS) ×1 IMPLANT
KIT TURNOVER CYSTO (KITS) ×1 IMPLANT
LABEL OR SOLS (LABEL) ×1 IMPLANT
LIGASURE LAP MARYLAND 5MM 37CM (ELECTROSURGICAL) ×1 IMPLANT
MANIFOLD NEPTUNE II (INSTRUMENTS) ×1 IMPLANT
NS IRRIG 500ML POUR BTL (IV SOLUTION) ×1 IMPLANT
PACK GYN LAPAROSCOPIC (MISCELLANEOUS) ×1 IMPLANT
PAD OB MATERNITY 4.3X12.25 (PERSONAL CARE ITEMS) ×1 IMPLANT
PAD PREP 24X41 OB/GYN DISP (PERSONAL CARE ITEMS) ×1 IMPLANT
SCRUB CHG 4% DYNA-HEX 4OZ (MISCELLANEOUS) ×1 IMPLANT
SET TUBE SMOKE EVAC HIGH FLOW (TUBING) ×1 IMPLANT
SLEEVE Z-THREAD 5X100MM (TROCAR) ×2 IMPLANT
SOL PREP PVP 2OZ (MISCELLANEOUS) ×1
SOLUTION PREP PVP 2OZ (MISCELLANEOUS) ×1 IMPLANT
STRIP CLOSURE SKIN 1/4X4 (GAUZE/BANDAGES/DRESSINGS) IMPLANT
SUT MNCRL 4-0 (SUTURE) ×1
SUT MNCRL 4-0 27XMFL (SUTURE) ×1
SUT VIC AB 0 UR5 27 (SUTURE) ×1 IMPLANT
SUT VIC AB 2-0 UR6 27 (SUTURE) ×1 IMPLANT
SUTURE MNCRL 4-0 27XMF (SUTURE) ×1 IMPLANT
TRAP FLUID SMOKE EVACUATOR (MISCELLANEOUS) ×1 IMPLANT
TROCAR XCEL NON-BLD 5MMX100MML (ENDOMECHANICALS) ×1 IMPLANT
WATER STERILE IRR 500ML POUR (IV SOLUTION) ×1 IMPLANT

## 2022-06-14 NOTE — Discharge Instructions (Addendum)
Laparoscopic Tubal Ligation Discharge Instructions  Laparoscopic tubal ligation and fulguration ties your fallopian tubes to prevent pregnancy in the future.   For the next three days, take ibuprofen and acetaminophen on a schedule, every 8 hours. You can take them together or you can intersperse them, and take one every four hours. I also gave you gabapentin for nighttime, to help you sleep and also to control pain. Take gabapentin medicines at night for at least the next 3 nights. You also have a narcotic, oxycodone, to take as needed if the above medicines don't help.  Postop constipation is a major cause of pain. Stay well hydrated, walk as you tolerate, and take over the counter senna as well as stool softeners if you need them.  RISKS AND COMPLICATIONS  Infection. Bleeding. Injury to surrounding organs. Anesthetic side effects. Failure of the procedure. Risks of future ectopic pregnancy  PROCEDURE  You may be given a medicine to help you relax (sedative) before the procedure. You will be given a medicine to make you sleep (general anesthetic) during the procedure. A tube will be put down your throat to help your breath while under general anesthesia. Two small cuts (incisions) are made in the lower abdominal area and one incision is made near the belly button. Your abdominal area will be inflated with a safe gas (carbon dioxide). This helps give the surgeon room to operate, visualize, and helps the surgeon avoid other organs. A thin, lighted tube (laparoscope) with a camera attached is inserted into your abdomen through the incision near the belly button. Other small instruments may also be inserted through other abdominal incisions. The fallopian tube is located and are removed. After the fallopian tube is removed, the gas is released from the abdomen. The incisions will be closed with stitches (sutures), and Dermabond. A bandage may be placed over the incisions.  AFTER THE  PROCEDURE  You will also have some mild abdominal discomfort for 3-7 days. You will be given pain medicine to ease any discomfort. As long as there are no problems, you may be allowed to go home. Someone will need to drive you home and be with you for at least 24 hours once home. You may have some mild discomfort in the throat. This is from the tube placed in your throat while you were sleeping. You may experience discomfort in the shoulder area from some trapped air between the liver and diaphragm. This sensation is normal and will slowly go away on its own.  HOME CARE INSTRUCTIONS  Take all medicines as directed. Only take over-the-counter or prescription medicines for pain, discomfort, or fever as directed by your caregiver. Resume daily activities as directed. Showers are preferred over baths. You may resume sexual activities in 1 week or as directed. Do not drive while taking narcotics.  SEEK MEDICAL CARE IF: . There is increasing abdominal pain. You feel lightheaded or faint. You have the chills. You have an oral temperature above 102 F (38.9 C). There is pus-like (purulent) drainage from any of the wounds. You are unable to pass gas or have a bowel movement. You feel sick to your stomach (nauseous) or throw up (vomit).  MAKE SURE YOU:  Understand these instructions. Will watch your condition. Will get help right away if you are not doing well or get worse.  ExitCare Patient Information 2013 [place].   AMBULATORY SURGERY  DISCHARGE INSTRUCTIONS  The drugs that you were given will stay in your system until tomorrow so for  the next 24 hours you should not:  Drive an automobile Make any legal decisions Drink any alcoholic beverage  You may resume regular meals tomorrow.  Today it is better to start with liquids and gradually work up to solid foods.  You may eat anything you prefer, but it is better to start with liquids, then soup and crackers, and gradually  work up to solid foods.  Please notify your doctor immediately if you have any unusual bleeding, trouble breathing, redness and pain at the surgery site, drainage, fever, or pain not relieved by medication.  Additional Instructions:  Please contact your physician with any problems or Same Day Surgery at [phone], Monday through Friday 6 am to 4 pm, or [place] at [facility] number at [phone].

## 2022-06-14 NOTE — Anesthesia Postprocedure Evaluation (Signed)
Anesthesia Post Note  Patient: Dana Benton  Procedure(s) Performed: LAPAROSCOPIC TUBAL LIGATION (Bilateral)  Patient location during evaluation: PACU Anesthesia Type: General Level of consciousness: awake and alert Pain management: pain level controlled Vital Signs Assessment: post-procedure vital signs reviewed and stable Respiratory status: spontaneous breathing, nonlabored ventilation and respiratory function stable Cardiovascular status: blood pressure returned to baseline and stable Postop Assessment: no apparent nausea or vomiting Anesthetic complications: no   No notable events documented.   Last Vitals:  Vitals:   06/14/22 1010 06/14/22 1021  BP:  107/76  Pulse: 72 84  Resp: 17 14  Temp: (!) 36.1 C 36.4 C  SpO2: 99% 100%    Last Pain:  Vitals:   06/14/22 1021  TempSrc: Temporal  PainSc: 0-No pain                 Iran Ouch

## 2022-06-14 NOTE — Op Note (Addendum)
Jewelisa Barmann 06/14/2022  PREOPERATIVE DIAGNOSIS:  Undesired fertility  POSTOPERATIVE DIAGNOSIS:  Undesired fertility, endometriosis  PROCEDURE:  Laparoscopic Bilateral Tubal Sterilization using Bipolar Coagulation    ANESTHESIA:  General endotracheal  ANESTHESIOLOGIST: [place] Ouch, MD Anesthesiologist: [place] Ouch, MD CRNA: Otho Perl, CRNA  SURGEON: Angelina Pih, MD  COMPLICATIONS:  None immediate.  ESTIMATED BLOOD LOSS:  Less than 20 ml.  FLUIDS: 800 ml LR.  URINE OUTPUT:  20 ml of clear urine.  INDICATIONS: 37 y.o. [id number]  with undesired fertility, desires permanent sterilization. Other reversible forms of contraception were discussed with patient; she declines all other modalities.  Risks of procedure discussed with patient including permanence of method, bleeding, infection, injury to surrounding organs and need for additional procedures including laparotomy, risk of regret.  Failure risk of 0.5-1% with increased risk of ectopic gestation if pregnancy occurs was also discussed with patient.      FINDINGS:  Normal uterus, tubes, and ovaries. Old endometriosis peritoneal windows in the right ovarian fossa and left uterosacral.  TECHNIQUE:  The patient was taken to the operating room where general anesthesia was obtained without difficulty.  She was then placed in the dorsal lithotomy position and prepared and draped in sterile fashion.  The bladder was cathed for an estimated amount of clear urine. After an adequate timeout was performed, a bivalved speculum was then placed in the patient's vagina, and the anterior lip of cervix grasped with the single-tooth tenaculum.  The uterine manipulator was then advanced into the uterus.  The speculum was removed from the vagina.   Attention was then turned to the patient's abdomen where a 5-mm skin incision was made in the umbilical fold.  The Optiview 5-mm trocar and sleeve were then advanced without  difficulty with the laparoscope under direct visualization into the abdomen.  The abdomen was then insufflated with carbon dioxide gas and adequate pneumoperitoneum was obtained.  A survey of the patient's pelvis and abdomen revealed entirely normal anatomy.     The fallopian tubes were observed and found to be normal in appearance. A 37m port was placed in the bilateral lower quadrants under direct visualization. A Ligasure device was then advanced through the operative port and used to coagulate and excise the distal portion of the Fallopian tube, including the fimbriated ends.  Good blanching and coagulation was noted at the site of the application.  There was no bleeding noted in the mesosalpinx.  A similar process was carried out on the right fallopian tube allowing for bilateral tubal sterilization.   Good hemostasis was noted overall.  Local analgesia was drizzled on both operative sites.The instruments were then removed from the patient's abdomen and the skin was closed with Dermabond.  The uterine manipulator and the tenaculum were removed from the vagina without complications. The patient tolerated the procedure well.  Sponge, lap, and needle counts were correct times two.  The patient was then taken to the recovery room awake, extubated and in stable condition.

## 2022-06-14 NOTE — Transfer of Care (Signed)
Immediate Anesthesia Transfer of Care Note  Patient: Dana Benton  Procedure(s) Performed: LAPAROSCOPIC TUBAL LIGATION (Bilateral)  Patient Location: PACU  Anesthesia Type:General  Level of Consciousness: drowsy  Airway & Oxygen Therapy: Patient Spontanous Breathing and Patient connected to face mask oxygen  Post-op Assessment: Report given to RN and Post -op Vital signs reviewed and stable  Post vital signs: Reviewed  Last Vitals:  Vitals Value Taken Time  BP 106/68 06/14/22 0930  Temp 36.2 C 06/14/22 0930  Pulse 84 06/14/22 0939  Resp 16 06/14/22 0939  SpO2 100 % 06/14/22 0939  Vitals shown include unvalidated device data.  Last Pain:  Vitals:   06/14/22 0930  TempSrc:   PainSc: 0-No pain         Complications: No notable events documented.

## 2022-06-14 NOTE — Interval H&P Note (Signed)
History and Physical Interval Note:  06/14/2022 8:18 AM  Dana Benton  has presented today for surgery, with the diagnosis of undesired fertility.  The various methods of treatment have been discussed with the patient and family. After consideration of risks, benefits and other options for treatment, the patient has consented to  Procedure(s): LAPAROSCOPIC TUBAL LIGATION (Bilateral) as a surgical intervention.  The patient's history has been reviewed, patient examined, no change in status, stable for surgery.  I have reviewed the patient's chart and labs.  Questions were answered to the patient's satisfaction.     Benjaman Kindler

## 2022-06-14 NOTE — Anesthesia Procedure Notes (Signed)
Procedure Name: Intubation Date/Time: 06/14/2022 8:39 AM  Performed by: Otho Perl, CRNAPre-anesthesia Checklist: Patient identified, Patient being monitored, Timeout performed, Emergency Drugs available and Suction available Patient Re-evaluated:Patient Re-evaluated prior to induction Oxygen Delivery Method: Circle system utilized Preoxygenation: Pre-oxygenation with 100% oxygen Induction Type: IV induction Ventilation: Mask ventilation without difficulty Laryngoscope Size: 3 and McGraph Grade View: Grade I Tube type: Oral Tube size: 7.0 mm Number of attempts: 1 Airway Equipment and Method: Stylet Placement Confirmation: ETT inserted through vocal cords under direct vision, positive ETCO2 and breath sounds checked- equal and bilateral Secured at: 21 cm Tube secured with: Tape Dental Injury: Teeth and Oropharynx as per pre-operative assessment

## 2022-06-15 ENCOUNTER — Encounter: Payer: Self-pay | Admitting: Obstetrics and Gynecology

## 2022-06-15 LAB — SURGICAL PATHOLOGY

## 2023-10-22 ENCOUNTER — Emergency Department

## 2023-10-22 ENCOUNTER — Other Ambulatory Visit: Payer: Self-pay

## 2023-10-22 ENCOUNTER — Ambulatory Visit: Admission: EM | Admit: 2023-10-22 | Discharge: 2023-10-22 | Disposition: A | Discharge status: Home / Self Care

## 2023-10-22 ENCOUNTER — Emergency Department
Admission: EM | Admit: 2023-10-22 | Discharge: 2023-10-22 | Disposition: A | Discharge status: Home / Self Care | Attending: Emergency Medicine | Admitting: Emergency Medicine

## 2023-10-22 DIAGNOSIS — N898 Other specified noninflammatory disorders of vagina: Secondary | ICD-10-CM | POA: Insufficient documentation

## 2023-10-22 DIAGNOSIS — R0602 Shortness of breath: Secondary | ICD-10-CM | POA: Insufficient documentation

## 2023-10-22 DIAGNOSIS — R002 Palpitations: Secondary | ICD-10-CM | POA: Diagnosis not present

## 2023-10-22 DIAGNOSIS — R109 Unspecified abdominal pain: Secondary | ICD-10-CM | POA: Insufficient documentation

## 2023-10-22 DIAGNOSIS — R7401 Elevation of levels of liver transaminase levels: Secondary | ICD-10-CM | POA: Insufficient documentation

## 2023-10-22 DIAGNOSIS — R000 Tachycardia, unspecified: Secondary | ICD-10-CM | POA: Diagnosis not present

## 2023-10-22 DIAGNOSIS — R5383 Other fatigue: Secondary | ICD-10-CM | POA: Insufficient documentation

## 2023-10-22 DIAGNOSIS — R509 Fever, unspecified: Secondary | ICD-10-CM | POA: Insufficient documentation

## 2023-10-22 DIAGNOSIS — Z3202 Encounter for pregnancy test, result negative: Secondary | ICD-10-CM | POA: Diagnosis not present

## 2023-10-22 DIAGNOSIS — Z87448 Personal history of other diseases of urinary system: Secondary | ICD-10-CM | POA: Insufficient documentation

## 2023-10-22 DIAGNOSIS — R9431 Abnormal electrocardiogram [ECG] [EKG]: Secondary | ICD-10-CM | POA: Insufficient documentation

## 2023-10-22 DIAGNOSIS — E876 Hypokalemia: Secondary | ICD-10-CM | POA: Diagnosis not present

## 2023-10-22 LAB — POCT URINALYSIS DIP (MANUAL ENTRY)
Bilirubin, UA: NEGATIVE
Glucose, UA: NEGATIVE mg/dL
Ketones, POC UA: NEGATIVE mg/dL
Nitrite, UA: NEGATIVE
Spec Grav, UA: 1.015 (ref 1.010–1.025)
Urobilinogen, UA: 0.2 U/dL
pH, UA: 6 (ref 5.0–8.0)

## 2023-10-22 LAB — BASIC METABOLIC PANEL WITH GFR
Anion gap: 8 (ref 5–15)
BUN: 10 mg/dL (ref 6–20)
CO2: 26 mmol/L (ref 22–32)
Calcium: 8.3 mg/dL — ABNORMAL LOW (ref 8.9–10.3)
Chloride: 103 mmol/L (ref 98–111)
Creatinine, Ser: 0.81 mg/dL (ref 0.44–1.00)
GFR, Estimated: >60 mL/min (ref >60)
Glucose, Bld: 112 mg/dL — ABNORMAL HIGH (ref 70–99)
Potassium: 3.3 mmol/L — ABNORMAL LOW (ref 3.5–5.1)
Sodium: 137 mmol/L (ref 135–145)

## 2023-10-22 LAB — HEPATIC FUNCTION PANEL
ALT: 94 U/L — ABNORMAL HIGH (ref 0–44)
AST: 69 U/L — ABNORMAL HIGH (ref 15–41)
Albumin: 3.4 g/dL — ABNORMAL LOW (ref 3.5–5.0)
Alkaline Phosphatase: 71 U/L (ref 38–126)
Bilirubin, Direct: <0.1 mg/dL (ref 0.0–0.2)
Total Bilirubin: 0.4 mg/dL (ref 0.0–1.2)
Total Protein: 7.4 g/dL (ref 6.5–8.1)

## 2023-10-22 LAB — URINALYSIS, ROUTINE W REFLEX MICROSCOPIC
Bilirubin Urine: NEGATIVE
Glucose, UA: NEGATIVE mg/dL
Hgb urine dipstick: NEGATIVE
Ketones, ur: NEGATIVE mg/dL
Leukocytes,Ua: NEGATIVE
Nitrite: NEGATIVE
Protein, ur: NEGATIVE mg/dL
Specific Gravity, Urine: 1.021 (ref 1.005–1.030)
pH: 5 (ref 5.0–8.0)

## 2023-10-22 LAB — RESP PANEL BY RT-PCR (RSV, FLU A&B, COVID)  RVPGX2
Influenza A by PCR: NEGATIVE
Influenza B by PCR: NEGATIVE
Resp Syncytial Virus by PCR: NEGATIVE
SARS Coronavirus 2 by RT PCR: NEGATIVE

## 2023-10-22 LAB — CBC
HCT: 39.5 % (ref 36.0–46.0)
Hemoglobin: 12.6 g/dL (ref 12.0–15.0)
MCH: 28 pg (ref 26.0–34.0)
MCHC: 31.9 g/dL (ref 30.0–36.0)
MCV: 87.8 fL (ref 80.0–100.0)
Platelets: 210 10*3/uL (ref 150–400)
RBC: 4.5 MIL/uL (ref 3.87–5.11)
RDW: 14.4 % (ref 11.5–15.5)
WBC: 8.3 10*3/uL (ref 4.0–10.5)
nRBC: 0 % (ref 0.0–0.2)

## 2023-10-22 LAB — TROPONIN I (HIGH SENSITIVITY)
Troponin I (High Sensitivity): 4 ng/L (ref <18)
Troponin I (High Sensitivity): 5 ng/L (ref <18)

## 2023-10-22 LAB — LACTIC ACID, PLASMA: Lactic Acid, Venous: 1 mmol/L (ref 0.5–1.9)

## 2023-10-22 LAB — LIPASE, BLOOD: Lipase: 44 U/L (ref 11–51)

## 2023-10-22 LAB — POCT URINE PREGNANCY: Preg Test, Ur: NEGATIVE

## 2023-10-22 LAB — MAGNESIUM: Magnesium: 2.2 mg/dL (ref 1.7–2.4)

## 2023-10-22 MED ORDER — IOHEXOL 350 MG/ML SOLN
100.0000 mL | Freq: Once | INTRAVENOUS | Status: AC | PRN
  Administered 2023-10-22: 100 mL via INTRAVENOUS

## 2023-10-22 MED ORDER — SODIUM CHLORIDE 0.9 % IV BOLUS
1000.0000 mL | Freq: Once | INTRAVENOUS | Status: AC
  Administered 2023-10-22: 1000 mL via INTRAVENOUS

## 2023-10-22 MED ORDER — POTASSIUM CHLORIDE CRYS ER 20 MEQ PO TBCR: 40.0000 meq | EXTENDED_RELEASE_TABLET | Freq: Once | ORAL | Status: DC

## 2023-10-22 NOTE — ED Provider Notes (Signed)
 [facility]    CSN: [id number] Arrival date & time: 10/22/23  9188      History   Chief Complaint Chief Complaint  Patient presents with   Flank Pain   Fever    HPI Dana Benton is a 38 y.o. female.   Patient presents today with a weeklong history of fatigue and malaise.  She reports that she was out in the heat 8 days ago with her children and felt like she pushed herself.  She started feeling fatigued and developed palpitations with her heart rate up to 120 at rest which is abnormal for her.  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  There were no significant abnormalities on her blood work but she did have slightly elevated transaminases.  She has since developed bilateral thoracic back/flank pain.  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  She denies any urinary symptoms including frequency, urgency, hematuria, dysuria.  She denies any nausea or vomiting.  She denies any recent injury or change in activity that explain her back pain.  She denies any recent antibiotics.  Overnight she had a fever up to 100.9 F which made her concern pyelonephritis prompting evaluation.  She has had some mild vaginal discharge but reports that this is prolonged and her baseline.  She has no concern for STI but is open to testing for BV and yeast.  Denies any associated pelvic pain.    Past Medical History:  Diagnosis Date   Gestational diabetes    patient states blood sugars have normalized since pregnancy   Migraines     Patient Active Problem List   Diagnosis Date Noted   NSVD (normal spontaneous vaginal delivery) 04/01/2022    Past Surgical History:  Procedure Laterality Date   KIDNEY SURGERY     age 48   LAPAROSCOPIC TUBAL LIGATION  Bilateral 06/14/2022   Procedure: LAPAROSCOPIC TUBAL LIGATION;  Surgeon: Verdon Keen, MD;  Location: [facility] ORS;  Service: Gynecology;  Laterality: Bilateral;    OB History     Gravida  2   Para  2   Term  2   Preterm      AB      Living  2      SAB      IAB      Ectopic      Multiple  0   Live Births  2            Home Medications    Prior to Admission medications   Medication Sig Start Date End Date Taking? Authorizing Provider  clobetasol cream (TEMOVATE) 0.05 % Apply topically 2 (two) times daily as needed. 07/28/17  Yes [provider]  acetaminophen  (TYLENOL ) 325 MG tablet Take 2 tablets (650 mg total) by mouth every 6 (six) hours as needed (for pain scale < 4). 04/01/22   Myron Delon, CNM    Family History Family History  Problem Relation Age of Onset   Thyroid disease Mother    Diabetes Father     Social History Social History   Tobacco Use   Smoking status: Never   Smokeless tobacco: Never  Vaping Use   Vaping status: Never Used  Substance Use Topics   Alcohol use: No   Drug use:  No     Allergies   Sulfa antibiotics   Review of Systems Review of Systems  Constitutional:  Positive for activity change, fatigue and fever. Negative for appetite change.  Respiratory:  Negative for cough and shortness of breath.   Cardiovascular:  Positive for palpitations. Negative for chest pain and leg swelling.  Gastrointestinal:  Negative for abdominal pain, diarrhea, nausea and vomiting.  Genitourinary:  Positive for flank pain and vaginal discharge (chronic at baseline). Negative for dysuria, frequency, hematuria, pelvic pain, urgency, vaginal bleeding and vaginal pain.  Musculoskeletal:  Positive for back pain. Negative for arthralgias and myalgias.     Physical Exam Triage Vital Signs ED Triage Vitals  Encounter Vitals Group     BP 10/22/23 0827 109/74     Girls Systolic BP Percentile --      Girls Diastolic BP Percentile --       Boys Systolic BP Percentile --      Boys Diastolic BP Percentile --      Pulse Rate 10/22/23 0827 (!) 112     Resp 10/22/23 0827 20     Temp 10/22/23 0827 98.3 F (36.8 C)     Temp Source 10/22/23 0827 Oral     SpO2 10/22/23 0827 99 %     Weight --      Height --      Head Circumference --      Peak Flow --      Pain Score 10/22/23 0828 4     Pain Loc --      Pain Education --      Exclude from Growth Chart --    No data found.  Updated Vital Signs BP 109/74 (BP Location: Right Arm)   Pulse (!) 112   Temp 98.3 F (36.8 C) (Oral)   Resp 20   LMP 09/21/2023 (Approximate)   SpO2 99%   Breastfeeding No   Visual Acuity Right Eye Distance:   Left Eye Distance:   Bilateral Distance:    Right Eye Near:   Left Eye Near:    Bilateral Near:     Physical Exam Vitals reviewed.  Constitutional:      General: She is awake. She is not in acute distress.    Appearance: Normal appearance. She is well-developed. She is not ill-appearing.     Comments: Very pleasant female appears stated age in no acute distress sitting comfortably in exam room  HENT:     Head: Normocephalic and atraumatic.   Cardiovascular:     Rate and Rhythm: Regular rhythm. Tachycardia present.     Heart sounds: Normal heart sounds, S1 normal and S2 normal. No murmur heard. Pulmonary:     Effort: Pulmonary effort is normal.     Breath sounds: Normal breath sounds. No wheezing, rhonchi or rales.     Comments: Clear to auscultation bilaterally Abdominal:     General: Bowel sounds are normal.     Palpations: Abdomen is soft.     Tenderness: There is no abdominal tenderness. There is no right CVA tenderness, left CVA tenderness, guarding or rebound.     Comments: Benign abdominal exam   Musculoskeletal:     Cervical back: No tenderness or bony tenderness.     Thoracic back: No tenderness or bony tenderness.     Lumbar back: No tenderness or bony tenderness.     Comments: No pain percussion of  vertebrae.  No tenderness palpation of paraspinal muscles.   Psychiatric:  Behavior: Behavior is cooperative.      UC Treatments / Results  Labs (all labs ordered are listed, but only abnormal results are displayed) Labs Reviewed  POCT URINALYSIS DIP (MANUAL ENTRY) - Abnormal; Notable for the following components:      Result Value   Blood, UA trace-intact (*)    Protein Ur, POC trace (*)    Leukocytes, UA Trace (*)    All other components within normal limits  URINE CULTURE  COMPREHENSIVE METABOLIC PANEL WITH GFR  CBC WITH DIFFERENTIAL/PLATELET  POCT URINE PREGNANCY  CERVICOVAGINAL ANCILLARY ONLY    EKG   Radiology No results found.  Procedures Procedures (including critical care time)  Medications Ordered in UC Medications - No data to display  Initial Impression / Assessment and Plan / UC Course  I have reviewed the triage vital signs and the nursing notes.  Pertinent labs & imaging results that were available during my care of the patient were reviewed by me and considered in my medical decision making (see chart for details).     Patient is well-appearing, afebrile, nontoxic.  She was noted to be tachycardic on intake so EKG was obtained particularly given she reported associated shortness of breath that showed T wave inversion in inferior leads and depression in V4; no previous to compare.  Initially we were discussing treating for pyelonephritis given she had abnormalities on her UA and flank pain, however, given abnormal EKG findings I recommend she go to the emergency room for further evaluation and management as we do not have access to stat labs or imaging in urgent care.  She was agreeable will go directly to [facility] for further evaluation and management.  She was able to time discharge and stay for private transport; declined EMS.  Final Clinical Impressions(s) / UC Diagnoses   Final diagnoses:  Flank pain  Tachycardia  Shortness of breath  Abnormal  EKG     Discharge Instructions      Given your abnormal EKG, shortness of breath, high heart rate, feeling poorly I think you need to go to the emergency room for further workup.  Please go directly there.     ED Prescriptions   None    PDMP not reviewed this encounter.   Sherrell Rocky POUR, PA-C 10/22/23 9075

## 2023-10-22 NOTE — ED Triage Notes (Addendum)
 Pt being seen in UC for flank pain, fever, fatigue, night sweats, and pressure with urination x3 days. Pt reports fever started last night (100.63F), pt reports taking tylenol . Pt found to have elevated heart rate upon assessment.

## 2023-10-22 NOTE — ED Triage Notes (Signed)
 Pt to ED for abnormal EKG, sent from [facility]. Pt had gone to UC for upper back pain, chills, fatigue, night sweats since 3 nights. Pt also has intermittent dry cough since beginning of June. Pt is not SOB. Skin is dry. No pain.

## 2023-10-22 NOTE — ED Provider Notes (Signed)
 [facility] Provider Note    Event Date/Time   First MD Initiated Contact with Patient 10/22/23 1017     (approximate)   History   Chief Complaint Abnormal ECG and Back Pain   HPI  Dana Benton is a 38 y.o. female with past medical history of migraines who presents to the ED complaining of flank pain.  Patient reports that she has had 1 week of increasing pain to both flanks as well as both sides of her thoracic back.  She describes it as a sharp pain that does not seem to be exacerbated or alleviated by nothing in particular, has been associated with mild difficulty breathing.  She has had some chills recently and noticed a fever of 101.5 last night.  She denies any abdominal pain, nausea, vomiting, dysuria, hematuria, or changes in her bowel movements.  She denies any pain in her chest, has had a dry cough recently.  She saw her PCP for this problem earlier in the week, had unremarkable labs at that time.     Physical Exam   Triage Vital Signs: ED Triage Vitals  Encounter Vitals Group     BP 10/22/23 0951 109/80     Girls Systolic BP Percentile --      Girls Diastolic BP Percentile --      Boys Systolic BP Percentile --      Boys Diastolic BP Percentile --      Pulse Rate 10/22/23 0951 (!) 116     Resp 10/22/23 0951 20     Temp 10/22/23 0951 98.3 F (36.8 C)     Temp Source 10/22/23 0951 Oral     SpO2 10/22/23 0951 100 %     Weight 10/22/23 0949 128 lb (58.1 kg)     Height 10/22/23 0949 5' 6 (1.676 m)     Head Circumference --      Peak Flow --      Pain Score 10/22/23 0946 0     Pain Loc --      Pain Education --      Exclude from Growth Chart --     Most recent vital signs: Vitals:   10/22/23 1130 10/22/23 1200  BP: 98/70 98/65  Pulse: 92 90  Resp: 16 12  Temp:    SpO2: 100% 100%    Constitutional: Alert and oriented. Eyes: Conjunctivae are normal. Head: Atraumatic. Nose: No congestion/rhinnorhea. Mouth/Throat: Mucous  membranes are moist.  Cardiovascular: Tachycardic, regular rhythm. Grossly normal heart sounds.  2+ radial pulses bilaterally. Respiratory: Normal respiratory effort.  No retractions. Lungs CTAB. Gastrointestinal: Soft and nontender.  No CVA tenderness bilaterally.  No distention. Musculoskeletal: No lower extremity tenderness nor edema.  Neurologic:  Normal speech and language. No gross focal neurologic deficits are appreciated.    ED Results / Procedures / Treatments   Labs (all labs ordered are listed, but only abnormal results are displayed) Labs Reviewed  BASIC METABOLIC PANEL WITH GFR - Abnormal; Notable for the following components:      Result Value   Potassium 3.3 (*)    Glucose, Bld 112 (*)    Calcium 8.3 (*)    All other components within normal limits  URINALYSIS, ROUTINE W REFLEX MICROSCOPIC - Abnormal; Notable for the following components:   Color, Urine YELLOW (*)    APPearance CLEAR (*)    All other components within normal limits  HEPATIC FUNCTION PANEL - Abnormal; Notable for the following components:   Albumin 3.4 (*)  AST 69 (*)    ALT 94 (*)    All other components within normal limits  RESP PANEL BY RT-PCR (RSV, FLU A&B, COVID)  RVPGX2  CULTURE, BLOOD (ROUTINE X 2)  CULTURE, BLOOD (ROUTINE X 2)  CBC  LIPASE, BLOOD  LACTIC ACID, PLASMA  MAGNESIUM   TROPONIN I (HIGH SENSITIVITY)  TROPONIN I (HIGH SENSITIVITY)     EKG  ED ECG REPORT I, Carlin Palin, the attending physician, personally viewed and interpreted this ECG.   Date: 10/22/2023  EKG Time: 9:48  Rate: 116  Rhythm: sinus tachycardia  Axis: Normal  Intervals:none  ST&T Change: Nonspecific T wave abnormality  RADIOLOGY Chest x-ray reviewed and interpreted by me with no infiltrate, edema, or effusion.  PROCEDURES:  Critical Care performed: No  Procedures   MEDICATIONS ORDERED IN ED: Medications  potassium chloride SA (KLOR-CON M) CR tablet 40 mEq (has no administration in time  range)  sodium chloride  0.9 % bolus 1,000 mL (0 mLs Intravenous Stopped 10/22/23 1224)  iohexol (OMNIPAQUE) 350 MG/ML injection 100 mL (100 mLs Intravenous Contrast Given 10/22/23 1117)     IMPRESSION / MDM / ASSESSMENT AND PLAN / ED COURSE  I reviewed the triage vital signs and the nursing notes.                              38 y.o. female with past medical history of migraines who presents to the ED with 1 week of increasing bilateral flank and upper back pain with some mild difficulty breathing, fevers, and chills.  Patient's presentation is most consistent with acute presentation with potential threat to life or bodily function.  Differential diagnosis includes, but is not limited to, ACS, PE, pneumonia, pneumothorax, pyelonephritis, kidney stone, cholecystitis, biliary colic, appendicitis, colitis, viral syndrome.  Patient nontoxic-appearing and in no acute distress, vital signs remarkable for tachycardia but otherwise reassuring.  She is afebrile now, but given reported fever last night we will initiate sepsis workup.  Chest x-ray is unremarkable and urinalysis pending, but did show some signs of infection on urgent care visit earlier today.  EKG shows no evidence of arrhythmia or ischemia, troponin within normal limits, and I doubt ACS.  We will further assess with CTA of her chest to rule out PE or occult pneumonia, also check CT of her abdomen/pelvis.  CTA chest is negative for PE or other acute finding, CT abdomen/pelvis is also unremarkable.  Labs without significant anemia, leukocytosis, electrolyte abnormality, or AKI.  Urinalysis unremarkable and lactic acid within normal limits and no source of infection to suggest sepsis at this time.  She does have a mild transaminitis, which seems likely due to viral infection given no evidence of biliary pathology on CT imaging.  Patient appropriate for discharge home with outpatient follow-up, was counseled to recheck liver enzymes in 1 week.   She was counseled to return to the ED for new or worsening symptoms, patient agrees with plan.      FINAL CLINICAL IMPRESSION(S) / ED DIAGNOSES   Final diagnoses:  Flank pain     Rx / DC Orders   ED Discharge Orders     None        Note:  This document was prepared using Dragon voice recognition software and may include unintentional dictation errors.   Palin Carlin, MD 10/22/23 [phone]

## 2023-10-22 NOTE — ED Notes (Signed)
 Patient is being discharged from the Urgent Care and sent to the Emergency Department via POV . Per PA Erin Raspet, patient is in need of higher level of care due to flank pain, tachycardia, shortness of breath, and abnormal EKG. Patient is aware and verbalizes understanding of plan of care.  Vitals:   10/22/23 0827  BP: 109/74  Pulse: (!) 112  Resp: 20  Temp: 98.3 F (36.8 C)  SpO2: 99%

## 2023-10-22 NOTE — Discharge Instructions (Addendum)
 Given your abnormal EKG, shortness of breath, high heart rate, feeling poorly I think you need to go to the emergency room for further workup.  Please go directly there.

## 2023-10-23 ENCOUNTER — Ambulatory Visit (HOSPITAL_COMMUNITY): Payer: Self-pay

## 2023-10-23 LAB — URINE CULTURE: Culture: NO GROWTH

## 2023-10-24 LAB — CERVICOVAGINAL ANCILLARY ONLY
Bacterial Vaginitis (gardnerella): NEGATIVE
Candida Glabrata: NEGATIVE
Candida Vaginitis: NEGATIVE
Comment: NEGATIVE
Comment: NEGATIVE
Comment: NEGATIVE
Comment: NEGATIVE
Trichomonas: NEGATIVE

## 2023-10-27 LAB — CULTURE, BLOOD (ROUTINE X 2)
Culture: NO GROWTH
Culture: NO GROWTH
Special Requests: ADEQUATE
